# Patient Record
Sex: Male | Born: 1935 | Race: White | Hispanic: No | Marital: Married | Smoking: Former smoker
Health system: Southern US, Community
[De-identification: ages and names within clinical notes are randomized; demographics above are authoritative.]

## PROBLEM LIST (undated history)

## (undated) DIAGNOSIS — Z8679 Personal history of other diseases of the circulatory system: Secondary | ICD-10-CM

## (undated) DIAGNOSIS — I2510 Atherosclerotic heart disease of native coronary artery without angina pectoris: Secondary | ICD-10-CM

## (undated) DIAGNOSIS — R0600 Dyspnea, unspecified: Secondary | ICD-10-CM

## (undated) DIAGNOSIS — H3530 Unspecified macular degeneration: Secondary | ICD-10-CM

## (undated) DIAGNOSIS — I10 Essential (primary) hypertension: Secondary | ICD-10-CM

## (undated) DIAGNOSIS — E785 Hyperlipidemia, unspecified: Secondary | ICD-10-CM

## (undated) DIAGNOSIS — N189 Chronic kidney disease, unspecified: Secondary | ICD-10-CM

## (undated) DIAGNOSIS — G4730 Sleep apnea, unspecified: Secondary | ICD-10-CM

## (undated) DIAGNOSIS — C801 Malignant (primary) neoplasm, unspecified: Secondary | ICD-10-CM

## (undated) DIAGNOSIS — M1990 Unspecified osteoarthritis, unspecified site: Secondary | ICD-10-CM

## (undated) HISTORY — PX: HERNIA REPAIR: SHX51

## (undated) HISTORY — PX: TONSILLECTOMY: SUR1361

## (undated) HISTORY — DX: Atherosclerotic heart disease of native coronary artery without angina pectoris: I25.10

## (undated) HISTORY — PX: CHOLECYSTECTOMY: SHX55

## (undated) HISTORY — PX: CARDIAC CATHETERIZATION: SHX172

## (undated) HISTORY — PX: CARDIAC VALVE REPLACEMENT: SHX585

## (undated) HISTORY — PX: EYE SURGERY: SHX253

---
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
## 2002-04-05 ENCOUNTER — Encounter: Payer: Self-pay | Admitting: Surgery

## 2002-04-05 ENCOUNTER — Inpatient Hospital Stay (HOSPITAL_COMMUNITY): Admission: AD | Admit: 2002-04-05 | Discharge: 2002-04-10 | Payer: Self-pay | Admitting: Cardiology

## 2002-04-06 ENCOUNTER — Encounter: Payer: Self-pay | Admitting: Thoracic Surgery (Cardiothoracic Vascular Surgery)

## 2002-04-07 ENCOUNTER — Encounter: Payer: Self-pay | Admitting: Surgery

## 2002-05-09 ENCOUNTER — Encounter: Admission: RE | Admit: 2002-05-09 | Discharge: 2002-05-09 | Payer: Self-pay | Admitting: Surgery

## 2002-05-09 ENCOUNTER — Encounter: Payer: Self-pay | Admitting: Surgery

## 2011-07-16 ENCOUNTER — Other Ambulatory Visit: Payer: Self-pay | Admitting: Dermatology

## 2013-05-17 ENCOUNTER — Other Ambulatory Visit: Payer: Self-pay | Admitting: Dermatology

## 2018-01-26 DIAGNOSIS — C801 Malignant (primary) neoplasm, unspecified: Secondary | ICD-10-CM

## 2018-01-26 HISTORY — DX: Malignant (primary) neoplasm, unspecified: C80.1

## 2018-07-12 ENCOUNTER — Other Ambulatory Visit (HOSPITAL_COMMUNITY): Payer: Self-pay | Admitting: General Surgery

## 2018-07-12 ENCOUNTER — Other Ambulatory Visit: Payer: Self-pay | Admitting: General Surgery

## 2018-07-12 DIAGNOSIS — C4359 Malignant melanoma of other part of trunk: Secondary | ICD-10-CM

## 2018-07-19 ENCOUNTER — Other Ambulatory Visit: Payer: Self-pay

## 2018-07-19 ENCOUNTER — Ambulatory Visit (HOSPITAL_COMMUNITY)
Admission: RE | Admit: 2018-07-19 | Discharge: 2018-07-19 | Disposition: A | Discharge status: Home / Self Care | Payer: Medicare Other | Source: Ambulatory Visit | Attending: General Surgery | Admitting: General Surgery

## 2018-07-19 DIAGNOSIS — C4359 Malignant melanoma of other part of trunk: Secondary | ICD-10-CM | POA: Insufficient documentation

## 2018-07-19 MED ORDER — TECHNETIUM TC 99M SULFUR COLLOID FILTERED
1.0000 | Freq: Once | INTRAVENOUS | Status: AC | PRN
  Administered 2018-07-19: 1 via INTRADERMAL

## 2018-07-20 NOTE — Progress Notes (Signed)
[place], [place] [address] [place] [address] Phone: [phone] Fax: [place] [address] [place]. [place]. [place] [address] Phone: [phone] Fax: [phone]      Your procedure is scheduled on June 30  Report to [facility] Main Entrance "A" at [place].M., and check in at the Admitting office.  Call this number if you have problems the morning of surgery:  [phone]  Call [phone] if you have any questions prior to your surgery date Monday-Friday 8am-4pm    Remember:  Do not eat or drink after midnight.  You may drink clear liquids until 0530 am the morning of surgery .   Clear liquids allowed are: Water, Non-Citrus Juices (without pulp), Carbonated Beverages, Clear Tea, Black Coffee Only, and Gatorade  Please complete your PRE-SURGERY ENSURE that was provided to you by 0530 am the morning of surgery.  Please, if able, drink it in one setting. DO NOT SIP.     Take these medicines the morning of surgery with A SIP OF WATER  atorvastatin (LIPITOR) carboxymethylcellulose (REFRESH PLUS) diltiazem (DILACOR XR)  RABEprazole (ACIPHEX)  traMADol (ULTRAM)  Follow your surgeon's instructions on when to stop Aspirin.  If no instructions were given by your surgeon then you will need to call the office to get those instructions.     Starting now, days prior to surgery STOP taking any Aleve, Naproxen, Ibuprofen, Motrin, Advil, Goody's, BC's, all herbal medications, fish oil, and all vitamins.    The Morning of Surgery  Do not wear jewelry, make-up or nail polish.  Do not wear lotions, powders, or perfumes/colognes, or deodorant  Do not shave 48 hours prior to surgery.  Men may shave face and neck.  Do not bring valuables to the hospital.  [facility] is not responsible for any belongings or valuables.  If you are a smoker, DO NOT  Smoke 24 hours prior to surgery IF you wear a CPAP at night please bring your mask, tubing, and machine the morning of surgery   Remember that you must have someone to transport you home after your surgery, and remain with you for 24 hours if you are discharged the same day.   Contacts, glasses, hearing aids, dentures or bridgework may not be worn into surgery.    Leave your suitcase in the car.  After surgery it may be brought to your room.  For patients admitted to the hospital, discharge time will be determined by your treatment team.  Patients discharged the day of surgery will not be allowed to drive home.    Special instructions:   - Preparing For Surgery  Before surgery, you can play an important role. Because skin is not sterile, your skin needs to be as free of germs as possible. You can reduce the number of germs on your skin by washing with CHG (chlorahexidine gluconate) Soap before surgery.  CHG is an antiseptic cleaner which kills germs and bonds with the skin to continue killing germs even after washing.    Oral Hygiene is also important to reduce your risk of infection.  Remember - BRUSH YOUR TEETH THE MORNING OF SURGERY WITH YOUR REGULAR TOOTHPASTE  Please do not use if you have an allergy to CHG or antibacterial soaps. If your skin becomes reddened/irritated stop using the CHG.  Do not shave (including legs and underarms) for at  least 48 hours prior to first CHG shower. It is OK to shave your face.  Please follow these instructions carefully.   1. Shower the NIGHT BEFORE SURGERY and the MORNING OF SURGERY with CHG Soap.   2. If you chose to wash your hair, wash your hair first as usual with your normal shampoo.  3. After you shampoo, rinse your hair and body thoroughly to remove the shampoo.  4. Use CHG as you would any other liquid soap. You can apply CHG directly to the skin and wash gently with a scrungie or a clean washcloth.   5. Apply the CHG Soap  to your body ONLY FROM THE NECK DOWN.  Do not use on open wounds or open sores. Avoid contact with your eyes, ears, mouth and genitals (private parts). Wash Face and genitals (private parts)  with your normal soap.   6. Wash thoroughly, paying special attention to the area where your surgery will be performed.  7. Thoroughly rinse your body with warm water from the neck down.  8. DO NOT shower/wash with your normal soap after using and rinsing off the CHG Soap.  9. Pat yourself dry with a CLEAN TOWEL.  10. Wear CLEAN PAJAMAS to bed the night before surgery, wear comfortable clothes the morning of surgery  11. Place CLEAN SHEETS on your bed the night of your first shower and DO NOT SLEEP WITH PETS.    Day of Surgery:  Do not apply any deodorants/lotions.  Please wear clean clothes to the hospital/surgery center.   Remember to brush your teeth WITH YOUR REGULAR TOOTHPASTE.   Please read over the following fact sheets that you were given.

## 2018-07-21 ENCOUNTER — Other Ambulatory Visit: Payer: Self-pay

## 2018-07-21 ENCOUNTER — Encounter (HOSPITAL_COMMUNITY)
Admission: RE | Admit: 2018-07-21 | Discharge: 2018-07-21 | Disposition: A | Discharge status: Home / Self Care | Payer: Medicare Other | Source: Ambulatory Visit | Attending: General Surgery | Admitting: General Surgery

## 2018-07-21 ENCOUNTER — Encounter (HOSPITAL_COMMUNITY): Payer: Self-pay

## 2018-07-21 DIAGNOSIS — C439 Malignant melanoma of skin, unspecified: Secondary | ICD-10-CM | POA: Diagnosis not present

## 2018-07-21 DIAGNOSIS — I10 Essential (primary) hypertension: Secondary | ICD-10-CM | POA: Insufficient documentation

## 2018-07-21 DIAGNOSIS — Z1159 Encounter for screening for other viral diseases: Secondary | ICD-10-CM | POA: Diagnosis not present

## 2018-07-21 DIAGNOSIS — Z79899 Other long term (current) drug therapy: Secondary | ICD-10-CM | POA: Insufficient documentation

## 2018-07-21 DIAGNOSIS — Z01812 Encounter for preprocedural laboratory examination: Secondary | ICD-10-CM | POA: Insufficient documentation

## 2018-07-21 DIAGNOSIS — E7800 Pure hypercholesterolemia, unspecified: Secondary | ICD-10-CM | POA: Insufficient documentation

## 2018-07-21 HISTORY — DX: Unspecified osteoarthritis, unspecified site: M19.90

## 2018-07-21 HISTORY — DX: Sleep apnea, unspecified: G47.30

## 2018-07-21 HISTORY — DX: Personal history of other diseases of the circulatory system: Z86.79

## 2018-07-21 HISTORY — DX: Unspecified macular degeneration: H35.30

## 2018-07-21 HISTORY — DX: Chronic kidney disease, unspecified: N18.9

## 2018-07-21 HISTORY — DX: Hyperlipidemia, unspecified: E78.5

## 2018-07-21 HISTORY — DX: Essential (primary) hypertension: I10

## 2018-07-21 HISTORY — DX: Dyspnea, unspecified: R06.00

## 2018-07-21 LAB — CBC
HCT: 41.2 % (ref 39.0–52.0)
Hemoglobin: 13.2 g/dL (ref 13.0–17.0)
MCH: 30.1 pg (ref 26.0–34.0)
MCHC: 32 g/dL (ref 30.0–36.0)
MCV: 94.1 fL (ref 80.0–100.0)
Platelets: 241 10*3/uL (ref 150–400)
RBC: 4.38 MIL/uL (ref 4.22–5.81)
RDW: 12.6 % (ref 11.5–15.5)
WBC: 9.2 10*3/uL (ref 4.0–10.5)
nRBC: 0 % (ref 0.0–0.2)

## 2018-07-21 LAB — BASIC METABOLIC PANEL
Anion gap: 7 (ref 5–15)
BUN: 36 mg/dL — ABNORMAL HIGH (ref 8–23)
CO2: 27 mmol/L (ref 22–32)
Calcium: 9.6 mg/dL (ref 8.9–10.3)
Chloride: 106 mmol/L (ref 98–111)
Creatinine, Ser: 1.83 mg/dL — ABNORMAL HIGH (ref 0.61–1.24)
GFR calc Af Amer: 38 mL/min — ABNORMAL LOW (ref >60)
GFR calc non Af Amer: 32 mL/min — ABNORMAL LOW (ref >60)
Glucose, Bld: 93 mg/dL (ref 70–99)
Potassium: 3.9 mmol/L (ref 3.5–5.1)
Sodium: 140 mmol/L (ref 135–145)

## 2018-07-21 NOTE — Progress Notes (Signed)
PCP -  Dr. Carolin Coy  MD  Cardiologist - Dr. Otho Perl  MD last Ov May 2020  Chest x-ray -  EKG - requested Stress Test - care everywhere ECHO - care everywhere Cardiac Cath -care everyehere   Sleep Study - does not remember CPAP - yes  Fasting Blood Sugar -  Checks Blood Sugar _____ times a day  Blood Thinner Instructions:n/a Aspirin Instructions:call surgeon,s office for instructions  Anesthesia review: yes  Patient denies shortness of breath, fever, cough and chest pain at PAT appointment   Patient verbalized understanding of instructions that were given to them at the PAT appointment. Patient was also instructed that they will need to review over the PAT instructions again at home before surgery.

## 2018-07-22 ENCOUNTER — Encounter (HOSPITAL_COMMUNITY): Payer: Self-pay

## 2018-07-22 ENCOUNTER — Other Ambulatory Visit (HOSPITAL_COMMUNITY)
Admission: RE | Admit: 2018-07-22 | Discharge: 2018-07-22 | Disposition: A | Discharge status: Home / Self Care | Payer: Medicare Other | Source: Ambulatory Visit | Attending: General Surgery | Admitting: General Surgery

## 2018-07-22 ENCOUNTER — Other Ambulatory Visit: Payer: Self-pay | Admitting: General Surgery

## 2018-07-22 DIAGNOSIS — Z01812 Encounter for preprocedural laboratory examination: Secondary | ICD-10-CM | POA: Diagnosis not present

## 2018-07-22 LAB — SARS CORONAVIRUS 2 (TAT 6-24 HRS): SARS Coronavirus 2: NEGATIVE

## 2018-07-22 NOTE — Anesthesia Preprocedure Evaluation (Addendum)
Anesthesia Evaluation  Patient identified by MRN, date of birth, ID band Patient awake    Reviewed: Allergy & Precautions, NPO status , Patient's Chart, lab work & pertinent test results  Airway Mallampati: II  TM Distance: >3 FB Neck ROM: Full    Dental no notable dental hx. (+) Teeth Intact, Dental Advisory Given   Pulmonary sleep apnea and Continuous Positive Airway Pressure Ventilation , former smoker,    Pulmonary exam normal breath sounds clear to auscultation       Cardiovascular hypertension, Normal cardiovascular exam+ Valvular Problems/Murmurs (s/p AVR 2004) AS  Rhythm:Regular Rate:Normal  Stress Test 2020 Negative, EF 62%  3-14 day ZioPatch 03/18/18: Report isn't viewable in [facility], but according to Dr. Sudie Grumbling 03/22/18 office note, "His monitor demonstrated some bradycardia with heart rates in the 50s. He had one period of some significant bradycardia with heart rates in the 40s in the evening but no other significant findings. 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 After discussion, he would like to see 1 of the electrophysiologist to see if he may benefit from a pacemaker. I do not think this is unreasonable to at least get an opinion  TEE 2020 EF 60-65, normal functioning AVR  LHC 2000 unremarkable   Neuro/Psych negative neurological ROS  negative psych ROS   GI/Hepatic negative GI ROS, Neg liver ROS,   Endo/Other  negative endocrine ROS  Renal/GU Renal InsufficiencyRenal disease  negative genitourinary   Musculoskeletal  (+) Arthritis ,   Abdominal   Peds  Hematology negative hematology ROS (+)   Anesthesia Other Findings melanoma  Reproductive/Obstetrics                           Anesthesia Physical Anesthesia Plan  ASA: III  Anesthesia Plan: General and Regional   Post-op Pain Management:  Regional for Post-op pain    Induction: Intravenous  PONV Risk Score and Plan: 2 and Ondansetron and Dexamethasone  Airway Management Planned: Oral ETT  Additional Equipment:   Intra-op Plan:   Post-operative Plan: Extubation in OR  Informed Consent: I have reviewed the patients History and Physical, chart, labs and discussed the procedure including the risks, benefits and alternatives for the proposed anesthesia with the patient or authorized representative who has indicated his/her understanding and acceptance.     Dental advisory given  Plan Discussed with: CRNA  Anesthesia Plan Comments: (PAT note written 07/22/2018 by Myra Gianotti, PA-C. )      Anesthesia Quick Evaluation

## 2018-07-22 NOTE — Progress Notes (Signed)
Anesthesia Chart Review:  Case: [id number] Date/Time: 07/26/18 0815   Procedures:      BACK MELANOMA EXCISION WITH SENTINEL LYMPH NODE BIOPSY (N/A )     BILATERAL AXILLARY SENTINEL NODE BIOPSY WITH BLUE DYE (Bilateral )   Anesthesia type: General   Pre-op diagnosis: MELANOMA   Location: [place] OR ROOM 01 / [place] OR   Surgeon: Rolm Bookbinder, MD      DISCUSSION: Patient is an 85 year old male scheduled for the above procedure.  History includes former smoker, HTN, aortic stenosis (s/p AVR, pericardial tissue ~ 03/2002), exertional dyspnea, OSA (CPAP), HLD, melanoma (back), macular degeneration, CKD (stage III), edema.  Nursing staff from Dr. Cristal Generous office reported that Dr. Otho Perl had been contacted regarding surgery plans and felt to be "low risk." He had an echo, stress, and event monitor earlier this year (see below). He is scheduled to see EP cardiology next month at his request due to HR in the low 50's with one evening episode in the 40's in February.   Renal function appears stable. Presurgical COVID test on 07/22/18 is still in proceed. If negative and otherwise no acute changes then I would anticipate that he can proceed as planned. Monitor of bradycardia.    VS: BP (!) 127/54   Pulse 71   Temp 36.8 C (Temporal)   Resp 20   Ht 5' 11.5" (1.816 m)   Wt 96.2 kg   SpO2 94%   BMI 29.17 kg/m    PROVIDERS: Jefm Petty, MD is PCP [facility]) - Abran Richard, MD is cardiologist [facility]). Last visit 06/14/18. He has a July appointment with EP cardiologist Mahala Menghini, MD following monitor that showed HR in the low 50's with one episode in the 40's with underlying 1st degree AV block. Francena Hanly, MD is pulmonologist [facility]). Last visit 06/30/18.  Rema Jasmine, [place], MD is neurologist (Sleep Medicine, [facility]).    LABS: Preoperative labs noted. CBC WNL. Review of renal function trends in [facility] show a BUN 27-36 and Cr 1.53-2.04 since 05/20/17. Cr most often in the  1.6-1.70 range.  (all labs ordered are listed, but only abnormal results are displayed)  Labs Reviewed  BASIC METABOLIC PANEL - Abnormal; Notable for the following components:      Result Value   BUN 36 (*)    Creatinine, Ser 1.83 (*)    GFR calc non Af Amer 32 (*)    GFR calc Af Amer 38 (*)    All other components within normal limits  CBC    PFTs 03/31/18 [facility] CE): FVC Pre-Actual 3.47 L  FVC %Pred-Pre 91.2 %  FVC Post 3.32 L  FVC %Pred-Post 87.3 %  FVC LLN 2.69 L  FEV1 Pre-Actual 2.45 L  FEV1 %Pred-Pre 88.7 %  FEV1 Post 2.52 L  FEV1 %Pred-Post 91.1 %  FEV1 LLN 1.86 L  FEV1FVC Pre-Actual 71 %  FEV1FVC Post 76 %  FEV1FVC LLN 58 %  SVC Pre-Actual 3.45 L  SVC %Pred-Pre 78.0 %  SVC Post 3.38 L  SVC %Pred-Post 76.2 %  RVN2 %Pred-Pre 138.6 %  TLCN2 Pre-Actual 7.41 L  TLCN2 %Pred-Pre 101.8 %  TLCN2 LLN 5.70 L  DLCOUNC Pre-Actual 21.71 ml/min/mmHg  DLCOUNC %Pred-Pre 89.7 %  DLCOUNC LLN 17.32 ml/min/mmHg  DLVA %Pred-Pre 109.0 %  FEV1SVC Pre-Actual 71 %  FEV1SVC Post-Actual 75     IMAGES: CXR 06/30/18 [facility] CE): FINDINGS: Prior median sternotomy, CABG and valve replacement. Heart and mediastinal contours  are within normal limits. No focal opacities or effusions. No acute bony abnormality. Degenerative spurring throughout the thoracic spine. IMPRESSION: No active cardiopulmonary disease.   EKG: 01/15/18 (Dr. Otho Perl): SB at 55 bpm. PVCs. Right BBB.   CV: Nuclear stress test 03/22/18 [facility] CE): Summary  Uneventful Lexiscan injection  Fixed inferior defect with no associated wall motion abnormality consistent  with diaphragmatic attenuation artifact.  There is no evidence of ischemia.  Normal LV function with a calculated EF of 62 %.  3-14 day ZioPatch 03/18/18: Report isn't viewable in [facility], but according to Dr. Sudie Grumbling 03/22/18 office note, "His monitor demonstrated some bradycardia with heart rates in the 64s. He had one period of some significant bradycardia  with heart rates in the 40s in the evening but no other significant findings. 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 After discussion, he would like to see 1 of the electrophysiologist to see if he may benefit from a pacemaker. I do not think this is unreasonable to at least get an opinion."  Echo 02/15/18 [facility] CE): Summary Mild concentric left ventricular hypertrophy. Normal left ventricular size and systolic function with no appreciable segmental abnormality. Ejection fraction is visually estimated at 60-65%. Diastolic function is impaired The aortic valve replacement is functioning normally.  A mean pressure gradient of 22 mmHg was obtained. Trace aortic regurgitation. The aortic root diameter is within normal limits. The ascending aorta diameter is within normal limits. RVSP = 35 mmHg  LHC 03/05/98 ([facility]): Coronary Artery Disease Left Main: normal LAD system: insignificant LCX system: normal RCA system: normal No significant CAD indicated Left Ventriculogram Ejection Fraction: 66%   Past Medical History:  Diagnosis Date  . Arthritis   . Cancer ([place]) 2020   melonomia on back  . CKD (chronic kidney disease)   . Dyspnea    with exertion  . History of aortic stenosis    s/p AVR, pericardial tissue ~ 03/2002  . Hyperlipidemia   . Hypertension   . Macular degeneration   . Sleep apnea    CPAP    Past Surgical History:  Procedure Laterality Date  . CARDIAC CATHETERIZATION    . CARDIAC VALVE REPLACEMENT  approc. 15 years ago   [employer] Financial [employer]  . CHOLECYSTECTOMY    . EYE SURGERY Bilateral    cateract removal  . HERNIA REPAIR    . TONSILLECTOMY      MEDICATIONS: . furosemide (LASIX) 20 MG tablet  . aspirin EC 81 MG tablet  . atorvastatin (LIPITOR) 20 MG tablet  . carboxymethylcellulose (REFRESH PLUS) 0.5 % SOLN  . cholecalciferol (VITAMIN D3) 25 MCG (1000 UT) tablet  . diltiazem (DILACOR XR) 240 MG 24 hr  capsule  . fenofibrate 160 MG tablet  . Magnesium 250 MG TABS  . Multiple Vitamins-Minerals (MULTIVITAMIN WITH MINERALS) tablet  . Multiple Vitamins-Minerals (PRESERVISION AREDS 2+MULTI VIT PO)  . RABEprazole (ACIPHEX) 20 MG tablet  . traMADol (ULTRAM) 50 MG tablet  . triamterene-hydrochlorothiazide (MAXZIDE-25) 37.5-25 MG tablet   No current facility-administered medications for this encounter.   Patient advised to contact surgeon for perioperative ASA instructions.   Myra Gianotti, PA-C Surgical Short Stay/Anesthesiology [facility] Phone [phone] [facility] Phone [phone] 07/22/2018 2:13 PM

## 2018-07-26 ENCOUNTER — Ambulatory Visit (HOSPITAL_COMMUNITY)
Admission: RE | Admit: 2018-07-26 | Discharge: 2018-07-26 | Disposition: A | Discharge status: Home / Self Care | Payer: Medicare Other | Attending: General Surgery | Admitting: General Surgery

## 2018-07-26 ENCOUNTER — Ambulatory Visit (HOSPITAL_COMMUNITY): Payer: Medicare Other | Admitting: Anesthesiology

## 2018-07-26 ENCOUNTER — Encounter (HOSPITAL_COMMUNITY): Payer: Self-pay | Admitting: Certified Registered"

## 2018-07-26 ENCOUNTER — Ambulatory Visit (HOSPITAL_COMMUNITY)
Admission: RE | Admit: 2018-07-26 | Discharge: 2018-07-26 | Disposition: A | Discharge status: Home / Self Care | Payer: Medicare Other | Source: Ambulatory Visit | Attending: General Surgery | Admitting: General Surgery

## 2018-07-26 ENCOUNTER — Ambulatory Visit (HOSPITAL_COMMUNITY): Payer: Medicare Other | Admitting: Vascular Surgery

## 2018-07-26 ENCOUNTER — Encounter (HOSPITAL_COMMUNITY)
Admission: RE | Disposition: A | Discharge status: Home / Self Care | Payer: Self-pay | Source: Home / Self Care | Attending: General Surgery

## 2018-07-26 ENCOUNTER — Other Ambulatory Visit: Payer: Self-pay

## 2018-07-26 DIAGNOSIS — Z8349 Family history of other endocrine, nutritional and metabolic diseases: Secondary | ICD-10-CM | POA: Insufficient documentation

## 2018-07-26 DIAGNOSIS — L821 Other seborrheic keratosis: Secondary | ICD-10-CM | POA: Insufficient documentation

## 2018-07-26 DIAGNOSIS — Z9842 Cataract extraction status, left eye: Secondary | ICD-10-CM | POA: Insufficient documentation

## 2018-07-26 DIAGNOSIS — E7800 Pure hypercholesterolemia, unspecified: Secondary | ICD-10-CM | POA: Diagnosis not present

## 2018-07-26 DIAGNOSIS — Z825 Family history of asthma and other chronic lower respiratory diseases: Secondary | ICD-10-CM | POA: Insufficient documentation

## 2018-07-26 DIAGNOSIS — C4359 Malignant melanoma of other part of trunk: Secondary | ICD-10-CM

## 2018-07-26 DIAGNOSIS — I10 Essential (primary) hypertension: Secondary | ICD-10-CM | POA: Insufficient documentation

## 2018-07-26 DIAGNOSIS — Z79899 Other long term (current) drug therapy: Secondary | ICD-10-CM | POA: Diagnosis not present

## 2018-07-26 DIAGNOSIS — Z886 Allergy status to analgesic agent status: Secondary | ICD-10-CM | POA: Insufficient documentation

## 2018-07-26 DIAGNOSIS — R001 Bradycardia, unspecified: Secondary | ICD-10-CM | POA: Diagnosis not present

## 2018-07-26 DIAGNOSIS — K219 Gastro-esophageal reflux disease without esophagitis: Secondary | ICD-10-CM | POA: Diagnosis not present

## 2018-07-26 DIAGNOSIS — M1990 Unspecified osteoarthritis, unspecified site: Secondary | ICD-10-CM | POA: Insufficient documentation

## 2018-07-26 DIAGNOSIS — Z9049 Acquired absence of other specified parts of digestive tract: Secondary | ICD-10-CM | POA: Diagnosis not present

## 2018-07-26 DIAGNOSIS — Z809 Family history of malignant neoplasm, unspecified: Secondary | ICD-10-CM | POA: Insufficient documentation

## 2018-07-26 DIAGNOSIS — Z9841 Cataract extraction status, right eye: Secondary | ICD-10-CM | POA: Diagnosis not present

## 2018-07-26 DIAGNOSIS — G4730 Sleep apnea, unspecified: Secondary | ICD-10-CM | POA: Diagnosis not present

## 2018-07-26 DIAGNOSIS — Z8249 Family history of ischemic heart disease and other diseases of the circulatory system: Secondary | ICD-10-CM | POA: Insufficient documentation

## 2018-07-26 DIAGNOSIS — Z952 Presence of prosthetic heart valve: Secondary | ICD-10-CM | POA: Insufficient documentation

## 2018-07-26 DIAGNOSIS — I440 Atrioventricular block, first degree: Secondary | ICD-10-CM | POA: Insufficient documentation

## 2018-07-26 DIAGNOSIS — Z87891 Personal history of nicotine dependence: Secondary | ICD-10-CM | POA: Diagnosis not present

## 2018-07-26 DIAGNOSIS — Z803 Family history of malignant neoplasm of breast: Secondary | ICD-10-CM | POA: Insufficient documentation

## 2018-07-26 HISTORY — PX: SENTINEL NODE BIOPSY: SHX6608

## 2018-07-26 HISTORY — PX: MELANOMA EXCISION WITH SENTINEL LYMPH NODE BIOPSY: SHX5267

## 2018-07-26 SURGERY — MELANOMA EXCISION WITH SENTINEL LYMPH NODE BIOPSY
Anesthesia: Regional | Site: Back

## 2018-07-26 MED ORDER — ACETAMINOPHEN 500 MG PO TABS
ORAL_TABLET | ORAL | Status: AC
  Administered 2018-07-26: 07:00:00 1000 mg via ORAL
  Filled 2018-07-26: qty 2

## 2018-07-26 MED ORDER — TECHNETIUM TC 99M SULFUR COLLOID FILTERED
0.5000 | Freq: Once | INTRAVENOUS | Status: AC | PRN
  Administered 2018-07-26: 0.5 via INTRADERMAL

## 2018-07-26 MED ORDER — FENTANYL CITRATE (PF) 250 MCG/5ML IJ SOLN
INTRAMUSCULAR | Status: AC
  Filled 2018-07-26: qty 5

## 2018-07-26 MED ORDER — SODIUM CHLORIDE 0.9% FLUSH: 3.0000 mL | Freq: Two times a day (BID) | INTRAVENOUS | Status: DC

## 2018-07-26 MED ORDER — SODIUM CHLORIDE 0.9 % IV SOLN
INTRAVENOUS | Status: DC | PRN
  Administered 2018-07-26: 09:00:00 25 ug/min via INTRAVENOUS

## 2018-07-26 MED ORDER — SODIUM CHLORIDE 0.9 % IV SOLN: INTRAVENOUS | Status: DC

## 2018-07-26 MED ORDER — LACTATED RINGERS IV SOLN
INTRAVENOUS | Status: DC
  Administered 2018-07-26: 07:00:00 via INTRAVENOUS

## 2018-07-26 MED ORDER — OXYCODONE HCL 5 MG PO TABS: 5.0000 mg | ORAL_TABLET | ORAL | Status: DC | PRN

## 2018-07-26 MED ORDER — FENTANYL CITRATE (PF) 100 MCG/2ML IJ SOLN
INTRAMUSCULAR | Status: AC
  Administered 2018-07-26: 08:00:00 100 ug via INTRAVENOUS
  Filled 2018-07-26: qty 2

## 2018-07-26 MED ORDER — CEFAZOLIN SODIUM-DEXTROSE 2-4 GM/100ML-% IV SOLN
INTRAVENOUS | Status: AC
  Filled 2018-07-26: qty 100

## 2018-07-26 MED ORDER — BUPIVACAINE-EPINEPHRINE 0.25% -1:200000 IJ SOLN
INTRAMUSCULAR | Status: DC | PRN
  Administered 2018-07-26: 10 mL

## 2018-07-26 MED ORDER — ENSURE PRE-SURGERY PO LIQD
296.0000 mL | Freq: Once | ORAL | Status: DC
  Filled 2018-07-26: qty 296

## 2018-07-26 MED ORDER — BACITRACIN ZINC 500 UNIT/GM EX OINT
TOPICAL_OINTMENT | CUTANEOUS | Status: AC
  Filled 2018-07-26: qty 28.35

## 2018-07-26 MED ORDER — SUGAMMADEX SODIUM 200 MG/2ML IV SOLN
INTRAVENOUS | Status: DC | PRN
  Administered 2018-07-26: 200 mg via INTRAVENOUS

## 2018-07-26 MED ORDER — ACETAMINOPHEN 650 MG RE SUPP: 650.0000 mg | RECTAL | Status: DC | PRN

## 2018-07-26 MED ORDER — FENTANYL CITRATE (PF) 100 MCG/2ML IJ SOLN
100.0000 ug | Freq: Once | INTRAMUSCULAR | Status: AC
  Administered 2018-07-26: 08:00:00 100 ug via INTRAVENOUS

## 2018-07-26 MED ORDER — CLONIDINE HCL (ANALGESIA) 100 MCG/ML EP SOLN
EPIDURAL | Status: DC | PRN
  Administered 2018-07-26 (×2): 50 ug

## 2018-07-26 MED ORDER — PROPOFOL 10 MG/ML IV BOLUS
INTRAVENOUS | Status: AC
  Filled 2018-07-26: qty 20

## 2018-07-26 MED ORDER — ONDANSETRON HCL 4 MG/2ML IJ SOLN
INTRAMUSCULAR | Status: DC | PRN
  Administered 2018-07-26: 4 mg via INTRAVENOUS

## 2018-07-26 MED ORDER — DEXAMETHASONE SODIUM PHOSPHATE 10 MG/ML IJ SOLN
INTRAMUSCULAR | Status: DC | PRN
  Administered 2018-07-26: 10 mg via INTRAVENOUS

## 2018-07-26 MED ORDER — LIDOCAINE 2% (20 MG/ML) 5 ML SYRINGE
INTRAMUSCULAR | Status: DC | PRN
  Administered 2018-07-26: 60 mg via INTRAVENOUS

## 2018-07-26 MED ORDER — ACETAMINOPHEN 500 MG PO TABS: 1000.0000 mg | ORAL_TABLET | ORAL | Status: DC

## 2018-07-26 MED ORDER — SODIUM CHLORIDE 0.9 % IV SOLN: 250.0000 mL | INTRAVENOUS | Status: DC | PRN

## 2018-07-26 MED ORDER — BUPIVACAINE-EPINEPHRINE 0.25% -1:200000 IJ SOLN
INTRAMUSCULAR | Status: AC
  Filled 2018-07-26: qty 1

## 2018-07-26 MED ORDER — TRAMADOL HCL 50 MG PO TABS: 50.0000 mg | ORAL_TABLET | Freq: Four times a day (QID) | ORAL | 0 refills | Status: DC | PRN

## 2018-07-26 MED ORDER — BUPIVACAINE-EPINEPHRINE (PF) 0.5% -1:200000 IJ SOLN
INTRAMUSCULAR | Status: DC | PRN
  Administered 2018-07-26 (×2): 25 mL via PERINEURAL

## 2018-07-26 MED ORDER — CHLORHEXIDINE GLUCONATE CLOTH 2 % EX PADS: 6.0000 | MEDICATED_PAD | Freq: Once | CUTANEOUS | Status: DC

## 2018-07-26 MED ORDER — ACETAMINOPHEN 325 MG PO TABS: 650.0000 mg | ORAL_TABLET | ORAL | Status: DC | PRN

## 2018-07-26 MED ORDER — LACTATED RINGERS IV SOLN
INTRAVENOUS | Status: DC | PRN
  Administered 2018-07-26 (×2): via INTRAVENOUS

## 2018-07-26 MED ORDER — METHYLENE BLUE 0.5 % INJ SOLN
INTRAVENOUS | Status: AC
  Filled 2018-07-26: qty 10

## 2018-07-26 MED ORDER — SODIUM CHLORIDE 0.9% FLUSH: 3.0000 mL | INTRAVENOUS | Status: DC | PRN

## 2018-07-26 MED ORDER — BACITRACIN ZINC 500 UNIT/GM EX OINT
TOPICAL_OINTMENT | CUTANEOUS | Status: DC | PRN
  Administered 2018-07-26: 1 via TOPICAL

## 2018-07-26 MED ORDER — ROCURONIUM BROMIDE 10 MG/ML (PF) SYRINGE
PREFILLED_SYRINGE | INTRAVENOUS | Status: DC | PRN
  Administered 2018-07-26: 40 mg via INTRAVENOUS

## 2018-07-26 MED ORDER — MIDAZOLAM HCL 2 MG/2ML IJ SOLN
INTRAMUSCULAR | Status: AC
  Filled 2018-07-26: qty 2

## 2018-07-26 MED ORDER — GABAPENTIN 300 MG PO CAPS
ORAL_CAPSULE | ORAL | Status: AC
  Administered 2018-07-26: 07:00:00 100 mg via ORAL
  Filled 2018-07-26: qty 1

## 2018-07-26 MED ORDER — ACETAMINOPHEN 500 MG PO TABS
1000.0000 mg | ORAL_TABLET | Freq: Once | ORAL | Status: AC
  Administered 2018-07-26: 07:00:00 1000 mg via ORAL

## 2018-07-26 MED ORDER — HEMOSTATIC AGENTS (NO CHARGE) OPTIME
TOPICAL | Status: DC | PRN
  Administered 2018-07-26: 1 via TOPICAL

## 2018-07-26 MED ORDER — PROPOFOL 10 MG/ML IV BOLUS
INTRAVENOUS | Status: DC | PRN
  Administered 2018-07-26: 100 mg via INTRAVENOUS

## 2018-07-26 MED ORDER — 0.9 % SODIUM CHLORIDE (POUR BTL) OPTIME
TOPICAL | Status: DC | PRN
  Administered 2018-07-26: 1000 mL

## 2018-07-26 MED ORDER — CEFAZOLIN SODIUM-DEXTROSE 2-4 GM/100ML-% IV SOLN
2.0000 g | INTRAVENOUS | Status: AC
  Administered 2018-07-26: 09:00:00 2 g via INTRAVENOUS

## 2018-07-26 MED ORDER — GABAPENTIN 100 MG PO CAPS
100.0000 mg | ORAL_CAPSULE | ORAL | Status: AC
  Administered 2018-07-26: 07:00:00 100 mg via ORAL
  Filled 2018-07-26: qty 1

## 2018-07-26 MED ORDER — SUCCINYLCHOLINE CHLORIDE 20 MG/ML IJ SOLN
INTRAMUSCULAR | Status: DC | PRN
  Administered 2018-07-26: 160 mg via INTRAVENOUS

## 2018-07-26 SURGICAL SUPPLY — 58 items
APPLIER CLIP 9.375 MED OPEN (MISCELLANEOUS) ×4
BLADE CLIPPER SURG (BLADE) IMPLANT
CHLORAPREP W/TINT 26 (MISCELLANEOUS) ×8 IMPLANT
CLIP APPLIE 9.375 MED OPEN (MISCELLANEOUS) ×2 IMPLANT
CLOSURE STERI-STRIP 1/2X4 (GAUZE/BANDAGES/DRESSINGS) ×1
CLSR STERI-STRIP ANTIMIC 1/2X4 (GAUZE/BANDAGES/DRESSINGS) ×1 IMPLANT
CONT SPEC 4OZ CLIKSEAL STRL BL (MISCELLANEOUS) ×10 IMPLANT
COVER SURGICAL LIGHT HANDLE (MISCELLANEOUS) ×6 IMPLANT
COVER WAND RF STERILE (DRAPES) ×4 IMPLANT
DECANTER SPIKE VIAL GLASS SM (MISCELLANEOUS) IMPLANT
DERMABOND ADVANCED (GAUZE/BANDAGES/DRESSINGS) ×2
DERMABOND ADVANCED .7 DNX12 (GAUZE/BANDAGES/DRESSINGS) ×2 IMPLANT
DRAPE LAPAROTOMY 100X72 PEDS (DRAPES) ×4 IMPLANT
DRAPE ORTHO SPLIT 77X108 STRL (DRAPES) ×4
DRAPE SURG ORHT 6 SPLT 77X108 (DRAPES) IMPLANT
DRSG TEGADERM 4X4.75 (GAUZE/BANDAGES/DRESSINGS) ×2 IMPLANT
ELECT CAUTERY BLADE 6.4 (BLADE) ×6 IMPLANT
ELECT REM PT RETURN 9FT ADLT (ELECTROSURGICAL) ×4
ELECTRODE REM PT RTRN 9FT ADLT (ELECTROSURGICAL) ×2 IMPLANT
GAUZE 4X4 16PLY RFD (DISPOSABLE) ×4 IMPLANT
GAUZE SPONGE 4X4 12PLY STRL (GAUZE/BANDAGES/DRESSINGS) ×4 IMPLANT
GLOVE BIO SURGEON STRL SZ7 (GLOVE) ×6 IMPLANT
GLOVE BIOGEL PI IND STRL 6.5 (GLOVE) IMPLANT
GLOVE BIOGEL PI IND STRL 7.5 (GLOVE) ×2 IMPLANT
GLOVE BIOGEL PI INDICATOR 6.5 (GLOVE) ×2
GLOVE BIOGEL PI INDICATOR 7.5 (GLOVE) ×4
GLOVE SURG SS PI 6.5 STRL IVOR (GLOVE) ×2 IMPLANT
GOWN STRL REUS W/ TWL LRG LVL3 (GOWN DISPOSABLE) ×4 IMPLANT
GOWN STRL REUS W/TWL LRG LVL3 (GOWN DISPOSABLE) ×8
HEMOSTAT ARISTA ABSORB 3G PWDR (HEMOSTASIS) ×2 IMPLANT
KIT BASIN OR (CUSTOM PROCEDURE TRAY) ×4 IMPLANT
KIT TURNOVER KIT B (KITS) ×4 IMPLANT
NDL 18GX1X1/2 (RX/OR ONLY) (NEEDLE) IMPLANT
NDL FILTER BLUNT 18X1 1/2 (NEEDLE) IMPLANT
NDL HYPO 25GX1X1/2 BEV (NEEDLE) IMPLANT
NEEDLE 18GX1X1/2 (RX/OR ONLY) (NEEDLE) IMPLANT
NEEDLE FILTER BLUNT 18X 1/2SAF (NEEDLE)
NEEDLE FILTER BLUNT 18X1 1/2 (NEEDLE) IMPLANT
NEEDLE HYPO 25GX1X1/2 BEV (NEEDLE) IMPLANT
NS IRRIG 1000ML POUR BTL (IV SOLUTION) ×4 IMPLANT
PACK SURGICAL SETUP 50X90 (CUSTOM PROCEDURE TRAY) ×4 IMPLANT
PAD ARMBOARD 7.5X6 YLW CONV (MISCELLANEOUS) ×8 IMPLANT
PENCIL BUTTON HOLSTER BLD 10FT (ELECTRODE) ×4 IMPLANT
PENCIL SMOKE EVACUATOR (MISCELLANEOUS) ×2 IMPLANT
SPONGE LAP 4X18 RFD (DISPOSABLE) ×4 IMPLANT
SUT ETHILON 2 0 FS 18 (SUTURE) ×6 IMPLANT
SUT MNCRL AB 4-0 PS2 18 (SUTURE) ×4 IMPLANT
SUT SILK 2 0 SH (SUTURE) ×4 IMPLANT
SUT VIC AB 2-0 SH 18 (SUTURE) ×4 IMPLANT
SUT VIC AB 2-0 SH 27 (SUTURE) ×2
SUT VIC AB 2-0 SH 27X BRD (SUTURE) IMPLANT
SUT VIC AB 3-0 SH 18 (SUTURE) ×2 IMPLANT
SUT VIC AB 3-0 SH 27 (SUTURE) ×4
SUT VIC AB 3-0 SH 27X BRD (SUTURE) ×2 IMPLANT
SUT VIC AB 3-0 SH 8-18 (SUTURE) ×2 IMPLANT
SYR CONTROL 10ML LL (SYRINGE) ×4 IMPLANT
TOWEL GREEN STERILE FF (TOWEL DISPOSABLE) ×4 IMPLANT
TOWEL OR 17X26 10 PK STRL BLUE (TOWEL DISPOSABLE) ×4 IMPLANT

## 2018-07-26 NOTE — Anesthesia Procedure Notes (Signed)
Procedure Name: Intubation Date/Time: 07/26/2018 8:38 AM Performed by: Gaylene Brooks, CRNA Pre-anesthesia Checklist: Patient identified, Emergency Drugs available, Suction available and Patient being monitored Patient Re-evaluated:Patient Re-evaluated prior to induction Oxygen Delivery Method: Circle System Utilized Preoxygenation: Pre-oxygenation with 100% oxygen Induction Type: IV induction Laryngoscope Size: Miller and 2 Grade View: Grade II Tube type: Oral Tube size: 7.5 mm Number of attempts: 1 Airway Equipment and Method: Stylet and Oral airway Placement Confirmation: ETT inserted through vocal cords under direct vision,  positive ETCO2 and breath sounds checked- equal and bilateral Secured at: 23 cm Tube secured with: Tape Dental Injury: Teeth and Oropharynx as per pre-operative assessment

## 2018-07-26 NOTE — Anesthesia Postprocedure Evaluation (Signed)
Anesthesia Post Note  Patient: SHRIHAAN PORZIO  Procedure(s) Performed: BACK MELANOMA EXCISION (N/A Back) LEFT SENTINEL LYMPH NODE BIOPSY (Left Arm Upper)     Patient location during evaluation: PACU Anesthesia Type: Regional and General Level of consciousness: awake and alert Pain management: pain level controlled Vital Signs Assessment: post-procedure vital signs reviewed and stable Respiratory status: spontaneous breathing, nonlabored ventilation, respiratory function stable and patient connected to nasal cannula oxygen Cardiovascular status: blood pressure returned to baseline and stable Postop Assessment: no apparent nausea or vomiting Anesthetic complications: no    Last Vitals:  Vitals:   07/26/18 1030 07/26/18 1040  BP: (!) 112/51 (!) 112/51  Pulse: (!) 57 (!) 55  Resp: 17 10  Temp:  36.6 C  SpO2: 94% 94%    Last Pain:  Vitals:   07/26/18 1040  TempSrc:   PainSc: 0-No pain                 Bishop Vanderwerf L Quantisha Marsicano

## 2018-07-26 NOTE — Transfer of Care (Signed)
Immediate Anesthesia Transfer of Care Note  Patient: Casey Rios  Procedure(s) Performed: BACK MELANOMA EXCISION (N/A Back) LEFT SENTINEL LYMPH NODE BIOPSY (Left Arm Upper)  Patient Location: PACU  Anesthesia Type:GA combined with regional for post-op pain  Level of Consciousness: awake, alert  and oriented  Airway & Oxygen Therapy: Patient Spontanous Breathing and Patient connected to nasal cannula oxygen  Post-op Assessment: Report given to RN, Post -op Vital signs reviewed and stable and Patient moving all extremities X 4  Post vital signs: Reviewed and stable  Last Vitals:  Vitals Value Taken Time  BP 116/59 07/26/18 1021  Temp 36.4 C 07/26/18 1020  Pulse 56 07/26/18 1022  Resp 13 07/26/18 1022  SpO2 98 % 07/26/18 1022  Vitals shown include unvalidated device data.  Last Pain:  Vitals:   07/26/18 0820  TempSrc:   PainSc: 0-No pain         Complications: No apparent anesthesia complications

## 2018-07-26 NOTE — Op Note (Addendum)
Preoperative diagnosis: T3 back melanoma Postoperative diagnosis: Same as above Procedure: 1.  Wide local excision of back melanoma 2.  Left axillary sentinel lymph node biopsy Surgeon: Dr. Serita Grammes Anesthesia: General with pectoral blocks Estimated blood loss: Minimal Complications: None Drains: None Specimens: 1.  Wide local excision of back melanoma marked short superior, long left side, double deep 2.  Left axillary sentinel lymph nodes Sponge needle count was correct at completion Decision to disposition to recovery in stable condition  Indications: This is an 85 year old male who had a changing back nevus.  This was shaved biopsied by Dr. Ubaldo Glassing and was found to be a T3 melanoma.  I saw him and evaluated him.  We discussed wide local excision.  He had a preoperative lymphoscintigram that showed drainage to both axillae.  We discussed wide local excision as well as likely bilateral axillary sentinel lymph node biopsy.  Procedure: After informed consent was obtained the patient first underwent technetium injection in the standard fashion around his melanoma.  He was given antibiotics.  SCDs were in place.  He was placed under general anesthesia without complication.  He was then rolled into the prone position and appropriately padded.  He was prepped and draped in the standard sterile surgical fashion.  Surgical timeout was then performed.  I marked up to centimeter margins around the shave biopsy site.  I then excised an area that was 6 x 13 cm of skin and subcutaneous tissue down to his fascia.  This was then marked as above.  Hemostasis was obtained.  I mobilized some of the tissue above the back fascia to bring the skin together.  I then closed the skin with multiple 3-0 interrupted Vicryl's.  I closed the dermis with a 4-0 Monocryl in a subcuticular fashion.  I placed five 2-0 nylon horizontal mattress sutures to hold this together as well.  Sterile dressings were placed.  He was  then rolled supine and brought back over to the operating room table to do the sentinel lymph node biopsy.  He was then prepped and draped again in the standard sterile surgical fashion.  A timeout was then repeated.  I listened to both sides he clearly had activity on the left side but there was no activity on the right side at all.  I made the decision just to do the left side.  I then infiltrated Marcaine and made an incision below the axillary hairline.  I used the neoprobe to guide me to excise what appeared to be 2 or 3 lymph nodes.  I then passed these off the table.  There was no background radioactivity.  Hemostasis was obtained.  I then closed this with 2-0 Vicryl, 3-0 Vicryl, 4-0 Monocryl.  Glue and Steri-Strips were applied.  He tolerated this well was extubated and transferred to recovery stable.  I attempted to call his wife but I got no answer.

## 2018-07-26 NOTE — Anesthesia Procedure Notes (Signed)
Anesthesia Regional Block: Pectoralis block   Pre-Anesthetic Checklist: ,, timeout performed, Correct Patient, Correct Site, Correct Laterality, Correct Procedure, Correct Position, site marked, Risks and benefits discussed,  Surgical consent,  Pre-op evaluation,  At surgeon's request and post-op pain management  Laterality: Left  Prep: Maximum Sterile Barrier Precautions used, chloraprep       Needles:  Injection technique: Single-shot  Needle Type: Echogenic Stimulator Needle     Needle Length: 9cm  Needle Gauge: 22     Additional Needles:   Procedures:,,,, ultrasound used (permanent image in chart),,,,  Narrative:  Start time: 07/26/2018 8:06 AM End time: 07/26/2018 8:16 AM Injection made incrementally with aspirations every 5 mL.  Performed by: Personally  Anesthesiologist: Freddrick March, MD  Additional Notes: Monitors applied. No increased pain on injection. No increased resistance to injection. Injection made in 5cc increments. Good needle visualization. Patient tolerated procedure well.

## 2018-07-26 NOTE — H&P (Signed)
85 yof referred by Dr Ubaldo Glassing. he is pretty healthy overall. has porcine aortic valve, osa on cpap and htn. followed by cardiology good activity level. no prior skin cancer. noted a back lesion he could feel and that was bleeding. underwent shave biopsy by Dr Ubaldo Glassing that shows superficial spreading melanoma with at least 2.5 mm depth, has pos peripheral and deep margins. has ulceration present. he is here to discuss options today   Past Surgical History Sabino Gasser, CMA; 07/12/2018 10:55 AM) Cataract Surgery  Bilateral. Gallbladder Surgery - Laparoscopic  Laparoscopic Inguinal Hernia Surgery  Left. Prostate Surgery - Removal  Tonsillectomy  TURP  Valve Replacement   Diagnostic Studies History Sabino Gasser, CMA; 07/12/2018 10:55 AM) Colonoscopy  5-10 years ago  Allergies Sabino Gasser, CMA; 07/12/2018 11:00 AM) CeleBREX *ANALGESICS - ANTI-INFLAMMATORY*  Allergies Reconciled   Medication History Sabino Gasser, CMA; 07/12/2018 10:59 AM) traMADol HCl (50MG  Tablet, Oral) Active. Atorvastatin Calcium (20MG  Tablet, Oral) Active. dilTIAZem HCl ER Coated Beads (240MG  Capsule ER 24HR, Oral) Active. Fenofibrate (160MG  Tablet, Oral) Active. RABEprazole Sodium (20MG  Tablet DR, Oral) Active. Triamterene-HCTZ (37.5-25MG  Tablet, Oral) Active. Lisinopril-hydroCHLOROthiazide (20-25MG  Tablet, Oral) Active. Furosemide (20MG  Tablet, Oral) Active. Medications Reconciled Trelegy Ellipta (100-62.5-25MCG/INH Aero Pow Br Act, Inhalation) Active.  Social History Sabino Gasser, [place]; 07/12/2018 10:55 AM) Alcohol use  Moderate alcohol use. Caffeine use  Coffee. No drug use  Tobacco use  Former smoker.  Family History Sabino Gasser, [place]; 07/12/2018 10:55 AM) Breast Cancer  Daughter. Cancer  Father. Heart Disease  Brother. Heart disease in male family member before age 26  Respiratory Condition  Mother. Thyroid problems  Father.  Other Problems Sabino Gasser, CMA;  07/12/2018 10:55 AM) Back Pain  Cholelithiasis  Enlarged Prostate  Gastroesophageal Reflux Disease  High blood pressure  Hypercholesterolemia  Pancreatitis   Review of Systems Sabino Gasser CMA; 07/12/2018 10:55 AM) General Present- Fatigue. Not Present- Appetite Loss, Chills, Fever, Night Sweats, Weight Gain and Weight Loss. Skin Present- New Lesions and Non-Healing Wounds. Not Present- Change in Wart/Mole, Dryness, Hives, Jaundice, Rash and Ulcer. HEENT Present- Hearing Loss and Wears glasses/contact lenses. Not Present- Earache, Hoarseness, Nose Bleed, Oral Ulcers, Ringing in the Ears, Seasonal Allergies, Sinus Pain, Sore Throat, Visual Disturbances and Yellow Eyes. Respiratory Not Present- Bloody sputum, Chronic Cough, Difficulty Breathing, Snoring and Wheezing. Gastrointestinal Not Present- Abdominal Pain, Bloating, Bloody Stool, Change in Bowel Habits, Chronic diarrhea, Constipation, Difficulty Swallowing, Excessive gas, Gets full quickly at meals, Hemorrhoids, Indigestion, Nausea, Rectal Pain and Vomiting. Male Genitourinary Present- Frequency. Not Present- Blood in Urine, Change in Urinary Stream, Impotence, Nocturia, Painful Urination, Urgency and Urine Leakage. Musculoskeletal Present- Back Pain and Joint Stiffness. Not Present- Joint Pain, Muscle Pain, Muscle Weakness and Swelling of Extremities. Neurological Present- Weakness. Not Present- Decreased Memory, Fainting, Headaches, Numbness, Seizures, Tingling, Tremor and Trouble walking. Psychiatric Not Present- Anxiety, Bipolar, Change in Sleep Pattern, Depression, Fearful and Frequent crying. Endocrine Not Present- Cold Intolerance, Excessive Hunger, Hair Changes, Heat Intolerance, Hot flashes and New Diabetes. Hematology Present- Blood Thinners and Easy Bruising. Not Present- Excessive bleeding, Gland problems, HIV and Persistent Infections.  Vitals Sabino Gasser CMA; 07/12/2018 10:58 AM) 07/12/2018 10:57 AM Weight: 212.25  lb Height: 71in Body Surface Area: 2.16 m Body Mass Index: 29.6 kg/m  Temp.: 98.57F (Oral)  Pulse: 78 (Regular)  BP: 130/72(Sitting, Left Arm, Standard) Physical Exam Rolm Bookbinder MD; 07/12/2018 11:34 AM) General Mental Status-Alert. Orientation-Oriented X3. Head and Neck Trachea-midline. Thyroid Gland Characteristics - normal size and consistency. Eye Sclera/Conjunctiva -  Bilateral-No scleral icterus. Chest and Lung Exam Chest and lung exam reveals -quiet, even and easy respiratory effort with no use of accessory muscles. Cardiovascular Cardiovascular examination reveals -normal heart sounds, regular rate and rhythm with no murmurs. Neurologic Neurologic evaluation reveals -alert and oriented x 3 with no impairment of recent or remote memory. Lymphatic General Lymphatics -Note: no [place] adenopathy. Head & Neck General Head & Neck Lymphatics: Bilateral - Description - Normal. Axillary General Axillary Region: Bilateral - Description - Normal.  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 I do think merits a sn biopsy and will do lymphoscintigram first to see where nodal basin is. we discussed sn biopsy and risks/benefits/indications today I also discussed wle with 2 cm margins. we discussed length of incision, external sutures, healing issues, wound issues possible. likely can go home same day as long as he uses cpap. He has bilateral ax nodes on lymphoscinitigram so will plan bilateral ax node biopsies if can identify today

## 2018-07-26 NOTE — Anesthesia Procedure Notes (Signed)
Anesthesia Regional Block: Pectoralis block   Pre-Anesthetic Checklist: ,, timeout performed, Correct Patient, Correct Site, Correct Laterality, Correct Procedure, Correct Position, site marked, Risks and benefits discussed,  Surgical consent,  Pre-op evaluation,  At surgeon's request and post-op pain management  Laterality: Right  Prep: Maximum Sterile Barrier Precautions used, chloraprep       Needles:  Injection technique: Single-shot  Needle Type: Echogenic Stimulator Needle     Needle Length: 9cm  Needle Gauge: 22     Additional Needles:   Procedures:,,,, ultrasound used (permanent image in chart),,,,  Narrative:  Start time: 07/26/2018 7:50 AM End time: 07/26/2018 8:00 AM Injection made incrementally with aspirations every 5 mL.  Performed by: Personally  Anesthesiologist: Freddrick March, MD  Additional Notes: Monitors applied. No increased pain on injection. No increased resistance to injection. Injection made in 5cc increments. Good needle visualization. Patient tolerated procedure well.

## 2018-07-26 NOTE — Interval H&P Note (Signed)
History and Physical Interval Note:  07/26/2018 7:21 AM  Casey Rios  has presented today for surgery, with the diagnosis of MELANOMA.  The various methods of treatment have been discussed with the patient and family. After consideration of risks, benefits and other options for treatment, the patient has consented to  Procedure(s): BACK MELANOMA EXCISION WITH BILATERAL SENTINEL LYMPH NODE BIOPSY AND BLUE DYE INJECTION (N/A) as a surgical intervention.  The patient's history has been reviewed, patient examined, no change in status, stable for surgery.  I have reviewed the patient's chart and labs.  Questions were answered to the patient's satisfaction.     Rolm Bookbinder

## 2018-07-26 NOTE — Discharge Instructions (Signed)
[facility] Office Phone Number [phone]  POST OP INSTRUCTIONS Take 400 mg of ibuprofen every 8 hours or 650 mg tylenol every 6 hours for next 72 hours then as needed. Use ice several times daily also. Always review your discharge instruction sheet given to you by the facility where your surgery was performed.  IF YOU HAVE DISABILITY OR FAMILY LEAVE FORMS, YOU MUST BRING THEM TO THE OFFICE FOR PROCESSING.  DO NOT GIVE THEM TO YOUR DOCTOR.  1. A prescription for pain medication may be given to you upon discharge.  Take your pain medication as prescribed, if needed.  If narcotic pain medicine is not needed, then you may take acetaminophen (Tylenol), naprosyn (Alleve) or ibuprofen (Advil) as needed. 2. Take your usually prescribed medications unless otherwise directed 3. If you need a refill on your pain medication, please contact your pharmacy.  They will contact our office to request authorization.  Prescriptions will not be filled after 5pm or on week-ends. 4. You should eat very light the first 24 hours after surgery, such as soup, crackers, pudding, etc.  Resume your normal diet the day after surgery. 5. Most patients will experience some swelling and bruising.  Ice packs will help.   6. It is common to experience some constipation if taking pain medication after surgery.  Increasing fluid intake and taking a stool softener will usually help or prevent this problem from occurring.  A mild laxative (Milk of Magnesia or Miralax) should be taken according to package directions if there are no bowel movements after 48 hours. 7. You may remove your bandages 48 hours after surgery and you may shower at that time.  You may have steri-strips (small skin tapes) in place directly over the incision.  These strips should be left on the skin and will come off on their own.  The glue will flake off over the next 2-3 weeks.  Any sutures or staples will be removed at the office during your  follow-up visit. 8. ACTIVITIES:  You may resume regular daily activities (gradually increasing) beginning the next day.  Wearing a good support bra or sports bra minimizes pain and swelling.  You may have sexual intercourse when it is comfortable. a. You may drive when you no longer are taking prescription pain medication, you can comfortably wear a seatbelt, and you can safely maneuver your car and apply brakes. b. RETURN TO WORK:  ______________________________________________________________________________________ 9. You should see your doctor in the office for a follow-up appointment approximately two weeks after your surgery.  Your doctors nurse will typically make your follow-up appointment when she calls you with your pathology report.  Expect your pathology report 3-4 business days after your surgery.  You may call to check if you do not hear from [place] after three days. 10. OTHER INSTRUCTIONS: _______________________________________________________________________________________________ _____________________________________________________________________________________________________________________________________ _____________________________________________________________________________________________________________________________________ _____________________________________________________________________________________________________________________________________  WHEN TO CALL DR Cheyna Retana: 1. Fever over 101.0 2. Nausea and/or vomiting. 3. Extreme swelling or bruising. 4. Continued bleeding from incision. 5. Increased pain, redness, or drainage from the incision.  The clinic staff is available to answer your questions during regular business hours.  Please dont hesitate to call and ask to speak to one of the nurses for clinical concerns.  If you have a medical emergency, go to the nearest emergency room or call 911.  A surgeon from [facility] is always on  call at the hospital.  For further questions, please visit [email or web address]

## 2018-07-27 ENCOUNTER — Encounter (HOSPITAL_COMMUNITY): Payer: Self-pay | Admitting: General Surgery

## 2018-08-10 ENCOUNTER — Emergency Department (HOSPITAL_BASED_OUTPATIENT_CLINIC_OR_DEPARTMENT_OTHER): Payer: Medicare Other

## 2018-08-10 ENCOUNTER — Other Ambulatory Visit: Payer: Self-pay

## 2018-08-10 ENCOUNTER — Emergency Department (HOSPITAL_BASED_OUTPATIENT_CLINIC_OR_DEPARTMENT_OTHER)
Admission: EM | Admit: 2018-08-10 | Discharge: 2018-08-11 | Disposition: A | Discharge status: Home / Self Care | Payer: Medicare Other | Attending: Emergency Medicine | Admitting: Emergency Medicine

## 2018-08-10 ENCOUNTER — Encounter (HOSPITAL_BASED_OUTPATIENT_CLINIC_OR_DEPARTMENT_OTHER): Payer: Self-pay | Admitting: Emergency Medicine

## 2018-08-10 DIAGNOSIS — Z79899 Other long term (current) drug therapy: Secondary | ICD-10-CM | POA: Diagnosis not present

## 2018-08-10 DIAGNOSIS — N189 Chronic kidney disease, unspecified: Secondary | ICD-10-CM | POA: Insufficient documentation

## 2018-08-10 DIAGNOSIS — Z87891 Personal history of nicotine dependence: Secondary | ICD-10-CM | POA: Diagnosis not present

## 2018-08-10 DIAGNOSIS — Z952 Presence of prosthetic heart valve: Secondary | ICD-10-CM | POA: Diagnosis not present

## 2018-08-10 DIAGNOSIS — I129 Hypertensive chronic kidney disease with stage 1 through stage 4 chronic kidney disease, or unspecified chronic kidney disease: Secondary | ICD-10-CM | POA: Insufficient documentation

## 2018-08-10 DIAGNOSIS — Z85820 Personal history of malignant melanoma of skin: Secondary | ICD-10-CM | POA: Insufficient documentation

## 2018-08-10 DIAGNOSIS — R1031 Right lower quadrant pain: Secondary | ICD-10-CM | POA: Diagnosis not present

## 2018-08-10 DIAGNOSIS — Z7982 Long term (current) use of aspirin: Secondary | ICD-10-CM | POA: Insufficient documentation

## 2018-08-10 DIAGNOSIS — Z87448 Personal history of other diseases of urinary system: Secondary | ICD-10-CM

## 2018-08-10 LAB — BASIC METABOLIC PANEL
Anion gap: 12 (ref 5–15)
BUN: 34 mg/dL — ABNORMAL HIGH (ref 8–23)
CO2: 26 mmol/L (ref 22–32)
Calcium: 9.4 mg/dL (ref 8.9–10.3)
Chloride: 98 mmol/L (ref 98–111)
Creatinine, Ser: 2.17 mg/dL — ABNORMAL HIGH (ref 0.61–1.24)
GFR calc Af Amer: 31 mL/min — ABNORMAL LOW (ref >60)
GFR calc non Af Amer: 26 mL/min — ABNORMAL LOW (ref >60)
Glucose, Bld: 121 mg/dL — ABNORMAL HIGH (ref 70–99)
Potassium: 4 mmol/L (ref 3.5–5.1)
Sodium: 136 mmol/L (ref 135–145)

## 2018-08-10 LAB — CBC WITH DIFFERENTIAL/PLATELET
Abs Immature Granulocytes: 0.03 10*3/uL (ref 0.00–0.07)
Basophils Absolute: 0.1 10*3/uL (ref 0.0–0.1)
Basophils Relative: 1 %
Eosinophils Absolute: 0.4 10*3/uL (ref 0.0–0.5)
Eosinophils Relative: 5 %
HCT: 37.8 % — ABNORMAL LOW (ref 39.0–52.0)
Hemoglobin: 12.3 g/dL — ABNORMAL LOW (ref 13.0–17.0)
Immature Granulocytes: 0 %
Lymphocytes Relative: 20 %
Lymphs Abs: 1.8 10*3/uL (ref 0.7–4.0)
MCH: 30.3 pg (ref 26.0–34.0)
MCHC: 32.5 g/dL (ref 30.0–36.0)
MCV: 93.1 fL (ref 80.0–100.0)
Monocytes Absolute: 0.8 10*3/uL (ref 0.1–1.0)
Monocytes Relative: 8 %
Neutro Abs: 6.1 10*3/uL (ref 1.7–7.7)
Neutrophils Relative %: 66 %
Platelets: 253 10*3/uL (ref 150–400)
RBC: 4.06 MIL/uL — ABNORMAL LOW (ref 4.22–5.81)
RDW: 12.8 % (ref 11.5–15.5)
WBC: 9.2 10*3/uL (ref 4.0–10.5)
nRBC: 0 % (ref 0.0–0.2)

## 2018-08-10 NOTE — ED Triage Notes (Signed)
Pt c/o RLQ pain since this afternoon. Pt states he has swelling and tenderness. Pt reports he has been having constipation since 6/30 post op.

## 2018-08-10 NOTE — ED Provider Notes (Signed)
[facility] Provider Note: Georgena Spurling, MD, FACEP  CSN: [id number] MRN: [id number] ARRIVAL: 08/10/18 at 2240 ROOM: [place]  Abdominal Pain   HISTORY OF PRESENT ILLNESS  08/10/18 10:49 PM Casey Rios is a 85 y.o. male who had a wide excision of a melanoma on his back on June 30.  Margins were clear.  He states that since the surgery his bowels have not been moving as well as usual.  He has been taking a stool softener and recently started taking MiraLAX at the advice of his surgeon, Dr. Donne Hazel.  This afternoon he developed pain in his right lower quadrant with a sensation of a mass or swelling.  The pain is been intermittent.  It is worse when he stands or moves.  He denies pain at the present time but on arrival he rated his pain is a 3 out of 10.  He has not had vomiting or diarrhea with it.  He has had some difficulty swallowing since his surgery which he likens to GERD but worse.  He has had bowel movements recently which were looser than usual but he attributes this to the MiraLAX.   Past Medical History:  Diagnosis Date  . Arthritis   . Cancer ([place]) 2020   melonomia on back  . CKD (chronic kidney disease)   . Dyspnea    with exertion  . History of aortic stenosis    s/p AVR, pericardial tissue ~ 03/2002  . Hyperlipidemia   . Hypertension   . Macular degeneration   . Sleep apnea    CPAP    Past Surgical History:  Procedure Laterality Date  . CARDIAC CATHETERIZATION    . CARDIAC VALVE REPLACEMENT     aortic valve replacement, pericardial tissue ~ 03/2002  . CHOLECYSTECTOMY    . EYE SURGERY Bilateral    cateract removal  . HERNIA REPAIR    . MELANOMA EXCISION WITH SENTINEL LYMPH NODE BIOPSY N/A 07/26/2018   Procedure: BACK MELANOMA EXCISION;  Surgeon: Rolm Bookbinder, MD;  Location: [facility];  Service: General;  Laterality: N/A;  . SENTINEL NODE BIOPSY Left 07/26/2018   Procedure: LEFT SENTINEL LYMPH NODE BIOPSY;  Surgeon:  Rolm Bookbinder, MD;  Location: [facility];  Service: General;  Laterality: Left;  . TONSILLECTOMY      No family history on file.  Social History   Tobacco Use  . Smoking status: Former Smoker    Packs/day: 2.00    Years: 20.00    Pack years: 40.00    Types: Cigarettes  . Smokeless tobacco: Never Used  Substance Use Topics  . Alcohol use: Not on file  . Drug use: Not on file    Prior to Admission medications   Medication Sig Start Date End Date Taking? Authorizing Provider  docusate sodium (COLACE) 100 MG capsule Take 100 mg by mouth 2 (two) times daily.   Yes [provider]  polyethylene glycol (MIRALAX / GLYCOLAX) 17 g packet Take 17 g by mouth daily.   Yes [provider]  aspirin EC 81 MG tablet Take 81 mg by mouth daily.    [provider]  atorvastatin (LIPITOR) 20 MG tablet Take 20 mg by mouth daily.    [provider]  carboxymethylcellulose (REFRESH PLUS) 0.5 % SOLN Place 1 drop into both eyes 3 (three) times daily as needed (dry eyes).    [provider]  cholecalciferol (VITAMIN D3) 25 MCG (1000 UT) tablet Take 1,000 Units  by mouth daily.    [provider]  diltiazem (DILACOR XR) 240 MG 24 hr capsule Take 240 mg by mouth daily.    [provider]  fenofibrate 160 MG tablet Take 80 mg by mouth daily.    [provider]  furosemide (LASIX) 20 MG tablet Take 20 mg by mouth daily. Take 1/2 pill once a day    [provider]  Magnesium 250 MG TABS Take 250 mg by mouth daily.    [provider]  Multiple Vitamins-Minerals (MULTIVITAMIN WITH MINERALS) tablet Take 1 tablet by mouth daily.    [provider]  Multiple Vitamins-Minerals (PRESERVISION AREDS 2+MULTI VIT PO) Take 1 capsule by mouth 2 (two) times a day.    [provider]  RABEprazole (ACIPHEX) 20 MG tablet Take 20 mg by mouth daily.    [provider]  traMADol (ULTRAM) 50 MG tablet Take 50 mg by  mouth 2 (two) times daily as needed (pain).    [provider]  traMADol (ULTRAM) 50 MG tablet Take 1 tablet (50 mg total) by mouth every 6 (six) hours as needed. 07/26/18   Rolm Bookbinder, MD  triamterene-hydrochlorothiazide (MAXZIDE-25) 37.5-25 MG tablet Take 1 tablet by mouth daily.    [provider]    Allergies Celebrex [celecoxib]   REVIEW OF SYSTEMS  Negative except as noted here or in the History of Present Illness.   PHYSICAL EXAMINATION  Initial Vital Signs Blood pressure (!) 142/67, pulse 76, temperature 98.4 F (36.9 C), resp. rate 18, height 5\' 11"  (1.803 m), weight 93.4 kg, SpO2 96 %.  Examination General: Well-developed, well-nourished male in no acute distress; appearance consistent with age of record HENT: normocephalic; atraumatic Eyes: pupils equal, round and reactive to light; extraocular muscles intact; bilateral pseudophakia Neck: supple Heart: regular rate and rhythm Lungs: clear to auscultation bilaterally Abdomen: soft; nondistended; nontender; no masses or hepatosplenomegaly; bowel sounds present Extremities: No deformity; full range of motion; pulses normal Neurologic: Awake, alert and oriented; motor function intact in all extremities and symmetric; no facial droop Skin: Warm and dry Psychiatric: Normal mood and affect   RESULTS  Summary of this visit's results, reviewed by myself:   EKG Interpretation  Date/Time:    Ventricular Rate:    PR Interval:    QRS Duration:   QT Interval:    QTC Calculation:   R Axis:     Text Interpretation:        Laboratory Studies: Results for orders placed or performed during the hospital encounter of 08/10/18 (from the past 24 hour(s))  CBC with Differential/Platelet     Status: Abnormal   Collection Time: 08/10/18 11:28 PM  Result Value Ref Range   WBC 9.2 4.0 - 10.5 K/uL   RBC 4.06 (L) 4.22 - 5.81 MIL/uL   Hemoglobin 12.3 (L) 13.0 - 17.0 g/dL   HCT 37.8 (L) 39.0 - 52.0 %   MCV  93.1 80.0 - 100.0 fL   MCH 30.3 26.0 - 34.0 pg   MCHC 32.5 30.0 - 36.0 g/dL   RDW 12.8 11.5 - 15.5 %   Platelets 253 150 - 400 K/uL   nRBC 0.0 0.0 - 0.2 %   Neutrophils Relative % 66 %   Neutro Abs 6.1 1.7 - 7.7 K/uL   Lymphocytes Relative 20 %   Lymphs Abs 1.8 0.7 - 4.0 K/uL   Monocytes Relative 8 %   Monocytes Absolute 0.8 0.1 - 1.0 K/uL   Eosinophils Relative 5 %  Eosinophils Absolute 0.4 0.0 - 0.5 K/uL   Basophils Relative 1 %   Basophils Absolute 0.1 0.0 - 0.1 K/uL   Immature Granulocytes 0 %   Abs Immature Granulocytes 0.03 0.00 - 0.07 K/uL  Basic metabolic panel     Status: Abnormal   Collection Time: 08/10/18 11:28 PM  Result Value Ref Range   Sodium 136 135 - 145 mmol/L   Potassium 4.0 3.5 - 5.1 mmol/L   Chloride 98 98 - 111 mmol/L   CO2 26 22 - 32 mmol/L   Glucose, Bld 121 (H) 70 - 99 mg/dL   BUN 34 (H) 8 - 23 mg/dL   Creatinine, Ser 2.17 (H) 0.61 - 1.24 mg/dL   Calcium 9.4 8.9 - 10.3 mg/dL   GFR calc non Af Amer 26 (L) >60 mL/min   GFR calc Af Amer 31 (L) >60 mL/min   Anion gap 12 5 - 15  Urinalysis, Routine w reflex microscopic     Status: Abnormal   Collection Time: 08/11/18 12:50 AM  Result Value Ref Range   Color, Urine STRAW (A) YELLOW   APPearance CLEAR CLEAR   Specific Gravity, Urine 1.010 1.005 - 1.030   pH 5.0 5.0 - 8.0   Glucose, UA NEGATIVE NEGATIVE mg/dL   Hgb urine dipstick NEGATIVE NEGATIVE   Bilirubin Urine NEGATIVE NEGATIVE   Ketones, ur NEGATIVE NEGATIVE mg/dL   Protein, ur NEGATIVE NEGATIVE mg/dL   Nitrite NEGATIVE NEGATIVE   Leukocytes,Ua NEGATIVE NEGATIVE   Imaging Studies: Ct Abdomen Pelvis Wo Contrast  Result Date: 08/11/2018 CLINICAL DATA:  Right lower quadrant pain EXAM: CT ABDOMEN AND PELVIS WITHOUT CONTRAST TECHNIQUE: Multidetector CT imaging of the abdomen and pelvis was performed following the standard protocol without IV contrast. COMPARISON:  CT chest 08/04/2018 FINDINGS: Lower chest: Lung bases demonstrate no acute  consolidation or effusion. Mild subpleural fibrosis in the right lower lobe. Normal heart size. Hepatobiliary: 13 mm hypodensity within the left hepatic lobe, likely a cyst. Status post cholecystectomy. No biliary dilatation. Pancreas: Unremarkable. No pancreatic ductal dilatation or surrounding inflammatory changes. Spleen: Normal in size without focal abnormality. Adrenals/Urinary Tract: Adrenal glands are normal. No hydronephrosis. Multiple low-attenuation foci within both kidneys, likely cysts but further evaluation limited without contrast. Slightly indistinct appearing bladder wall. Stomach/Bowel: Stomach is nonenlarged. No dilated small bowel. Negative appendix. Mild stool in the colon. No colon wall thickening. Vascular/Lymphatic: Moderate to marked aortic atherosclerosis without aneurysm. No significant adenopathy. Reproductive: Status post hysterectomy. No adnexal masses. Other: Negative for free air or free fluid. Musculoskeletal: Mild scoliosis of the spine. Diffuse degenerative changes throughout the lumbar spine. Scattered low-density foci within the lumbar vertebra, appear contiguous with the endplates and are probably Schmorl's nodes. IMPRESSION: 1. No CT evidence for acute intra-abdominal or pelvic abnormality. 2. Slightly indistinct appearance of the urinary bladder with minimal wall thickening, questionable for cystitis. Electronically Signed   By: Donavan Foil M.D.   On: 08/11/2018 01:26    ED COURSE and MDM  Nursing notes and initial vitals signs, including pulse oximetry, reviewed.  Vitals:   08/10/18 2245 08/10/18 2246 08/11/18 0103  BP: (!) 142/67  126/67  Pulse: 76  68  Resp: 18  18  Temp: 98.4 F (36.9 C)    SpO2: 96%  95%  Weight:  93.4 kg   Height:  5\' 11"  (1.803 m)    1:48 AM Abdomen soft, nontender.  Patient's CT scan reassuring.  He was advised of his elevated creatinine which is higher than it  was 3 weeks ago.  He does have known chronic kidney disease.  He was  advised to contact his primary care physician, Dr. Jeralene Huff.   PROCEDURES    ED DIAGNOSES     ICD-10-CM   1. Right lower quadrant abdominal pain  R10.31   2. History of chronic kidney disease  Z87.448        Belinda [place], MD 08/11/18 [phone]

## 2018-08-11 DIAGNOSIS — R1031 Right lower quadrant pain: Secondary | ICD-10-CM | POA: Diagnosis not present

## 2018-08-11 LAB — URINALYSIS, ROUTINE W REFLEX MICROSCOPIC
Bilirubin Urine: NEGATIVE
Glucose, UA: NEGATIVE mg/dL
Hgb urine dipstick: NEGATIVE
Ketones, ur: NEGATIVE mg/dL
Leukocytes,Ua: NEGATIVE
Nitrite: NEGATIVE
Protein, ur: NEGATIVE mg/dL
Specific Gravity, Urine: 1.01 (ref 1.005–1.030)
pH: 5 (ref 5.0–8.0)

## 2018-08-11 NOTE — ED Notes (Signed)
Returned from CT.

## 2018-08-11 NOTE — ED Notes (Signed)
Patient transported to CT 

## 2018-08-14 ENCOUNTER — Emergency Department (HOSPITAL_BASED_OUTPATIENT_CLINIC_OR_DEPARTMENT_OTHER)
Admission: EM | Admit: 2018-08-14 | Discharge: 2018-08-14 | Disposition: A | Discharge status: Home / Self Care | Payer: Medicare Other | Attending: Emergency Medicine | Admitting: Emergency Medicine

## 2018-08-14 ENCOUNTER — Other Ambulatory Visit: Payer: Self-pay

## 2018-08-14 ENCOUNTER — Encounter (HOSPITAL_BASED_OUTPATIENT_CLINIC_OR_DEPARTMENT_OTHER): Payer: Self-pay | Admitting: *Deleted

## 2018-08-14 DIAGNOSIS — I129 Hypertensive chronic kidney disease with stage 1 through stage 4 chronic kidney disease, or unspecified chronic kidney disease: Secondary | ICD-10-CM | POA: Diagnosis not present

## 2018-08-14 DIAGNOSIS — Z85828 Personal history of other malignant neoplasm of skin: Secondary | ICD-10-CM | POA: Insufficient documentation

## 2018-08-14 DIAGNOSIS — Z79899 Other long term (current) drug therapy: Secondary | ICD-10-CM | POA: Insufficient documentation

## 2018-08-14 DIAGNOSIS — R2232 Localized swelling, mass and lump, left upper limb: Secondary | ICD-10-CM | POA: Insufficient documentation

## 2018-08-14 DIAGNOSIS — Z952 Presence of prosthetic heart valve: Secondary | ICD-10-CM | POA: Insufficient documentation

## 2018-08-14 DIAGNOSIS — Z87891 Personal history of nicotine dependence: Secondary | ICD-10-CM | POA: Insufficient documentation

## 2018-08-14 DIAGNOSIS — M545 Low back pain, unspecified: Secondary | ICD-10-CM

## 2018-08-14 DIAGNOSIS — N189 Chronic kidney disease, unspecified: Secondary | ICD-10-CM | POA: Diagnosis not present

## 2018-08-14 DIAGNOSIS — R109 Unspecified abdominal pain: Secondary | ICD-10-CM | POA: Diagnosis not present

## 2018-08-14 MED ORDER — PREDNISONE 20 MG PO TABS: 40.0000 mg | ORAL_TABLET | Freq: Every day | ORAL | 0 refills | Status: DC

## 2018-08-14 MED ORDER — METHOCARBAMOL 500 MG PO TABS: 500.0000 mg | ORAL_TABLET | Freq: Three times a day (TID) | ORAL | 0 refills | Status: DC | PRN

## 2018-08-14 NOTE — ED Notes (Signed)
ED Provider at bedside. 

## 2018-08-14 NOTE — ED Provider Notes (Signed)
[facility]ERG[facility] DEPARTMENT Provider Note   CSN: [id number] Arrival date & time: 08/14/18  0950    History   Chief Complaint Chief Complaint  Patient presents with  . Abdominal Pain    HPI Casey Rios is a 85 y.o. male.     HPI Patient presents with right back pain that radiates to his abdomen.  Right lower back.  Is had over the last 2 to 3 weeks.  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  Has been seen in the ER few days ago and had CT scan that was reassuring at that time.  Thought could have been due to constipation.  No fevers or chills.  No pain when he has a bowel movement but states when he turns to wipe he has pain in his back.  Certain positions seem to make the pain worse.  No numbness or weakness.  No dysuria. States he does have some swelling of his left axilla.  Had lymph node biopsy there.  States it does not seem more swollen than it did before. Past Medical History:  Diagnosis Date  . Arthritis   . 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 CKD (chronic kidney disease)   . Dyspnea    with exertion  . History of aortic stenosis    s/p AVR, pericardial tissue ~ 03/2002  . Hyperlipidemia   . Hypertension   . Macular degeneration   . Sleep apnea    CPAP    There are no active problems to display for this patient.   Past Surgical History:  Procedure Laterality Date  . CARDIAC CATHETERIZATION    . CARDIAC VALVE REPLACEMENT     aortic valve replacement, pericardial tissue ~ 03/2002  . CHOLECYSTECTOMY    . EYE SURGERY Bilateral    cateract removal  . HERNIA REPAIR    . MELANOMA EXCISION WITH SENTINEL LYMPH NODE BIOPSY N/A 07/26/2018   Procedure: BACK MELANOMA EXCISION;  Surgeon: Rolm Bookbinder, MD;  Location: [facility];  Service: General;  Laterality: N/A;  . SENTINEL NODE BIOPSY Left 07/26/2018   Procedure: LEFT SENTINEL LYMPH NODE BIOPSY;  Surgeon: Rolm Bookbinder, MD;  Location: [facility];  Service: General;  Laterality: Left;  .  TONSILLECTOMY          Home Medications    Prior to Admission medications   Medication Sig Start Date End Date Taking? Authorizing Provider  aspirin EC 81 MG tablet Take 81 mg by mouth daily.   Yes [provider]  atorvastatin (LIPITOR) 20 MG tablet Take 20 mg by mouth daily.   Yes [provider]  carboxymethylcellulose (REFRESH PLUS) 0.5 % SOLN Place 1 drop into both eyes 3 (three) times daily as needed (dry eyes).   Yes [provider]  cholecalciferol (VITAMIN D3) 25 MCG (1000 UT) tablet Take 1,000 Units by mouth daily.   Yes [provider]  diltiazem (DILACOR XR) 240 MG 24 hr capsule Take 240 mg by mouth daily.   Yes [provider]  docusate sodium (COLACE) 100 MG capsule Take 100 mg by mouth 2 (two) times daily.   Yes [provider]  fenofibrate 160 MG tablet Take 80 mg by mouth daily.   Yes [provider]  furosemide (LASIX) 20 MG tablet Take 20 mg by mouth daily. Take 1/2 pill once a day   Yes [provider]  Magnesium 250 MG TABS Take 250 mg by mouth daily.   Yes [provider]  Multiple Vitamins-Minerals (MULTIVITAMIN WITH MINERALS) tablet Take 1 tablet by mouth daily.   Yes [provider]  Multiple Vitamins-Minerals (PRESERVISION AREDS 2+MULTI VIT PO) Take 1 capsule by mouth 2 (two) times a day.   Yes [provider]  polyethylene glycol (MIRALAX / GLYCOLAX) 17 g packet Take 17 g by mouth daily.   Yes [provider]  RABEprazole (ACIPHEX) 20 MG tablet Take 20 mg by mouth daily.   Yes [provider]  traMADol (ULTRAM) 50 MG tablet Take 1 tablet (50 mg total) by mouth every 6 (six) hours as needed. 07/26/18  Yes Rolm Bookbinder, MD  triamterene-hydrochlorothiazide (MAXZIDE-25) 37.5-25 MG tablet Take 1 tablet by mouth daily.   Yes [provider]  methocarbamol (ROBAXIN) 500 MG tablet Take 1 tablet (500 mg total) by mouth every 8 (eight) hours as  needed for muscle spasms. 08/14/18   Davonna Belling, MD  predniSONE (DELTASONE) 20 MG tablet Take 2 tablets (40 mg total) by mouth daily. 08/14/18   Davonna Belling, MD    Family History Family History  Problem Relation Age of Onset  . Diabetes Mother   . Emphysema Mother   . Cancer Father   . Heart attack Brother     Social History Social History   Tobacco Use  . Smoking status: Former Smoker    Packs/day: 2.00    Years: 20.00    Pack years: 40.00    Types: Cigarettes  . Smokeless tobacco: Never Used  Substance Use Topics  . Alcohol use: Yes    Alcohol/week: 1.0 standard drinks    Types: 1 Glasses of wine per week    Comment: daily  . Drug use: Never     Allergies   Celebrex [celecoxib]   Review of Systems Review of Systems  Constitutional: Negative for appetite change and fever.  Cardiovascular: Negative for chest pain.  Gastrointestinal: Positive for abdominal pain and diarrhea.  Genitourinary: Negative for flank pain.  Musculoskeletal: Positive for back pain.  Skin: Negative for rash.  Neurological: Negative for weakness.  Psychiatric/Behavioral: Negative for confusion.     Physical Exam Updated Vital Signs BP (!) 148/74 (BP Location: Right Arm)   Pulse 78   Temp 98.2 F (36.8 C) (Oral)   Resp 18   Ht 5\' 11"  (1.803 m)   Wt 94.4 kg   SpO2 99%   BMI 29.02 kg/m   Physical Exam Vitals signs and nursing note reviewed.  Cardiovascular:     Rate and Rhythm: Normal rate.  Pulmonary:     Breath sounds: No rhonchi.  Abdominal:     General: There is no abdominal bruit.     Tenderness: There is no abdominal tenderness.     Hernia: No hernia is present.  Genitourinary:    Comments: No right CVA tenderness. Skin:    Comments: No rash on right flank.  Neurological:     General: No focal deficit present.     Mental Status: He is alert.   Musculoskeletal: Patient has pain with active straight leg raise on left side.  No pain with passive straight  leg raise.  Sensation intact over bilateral lower extremities.  Tenderness over right lower back musculature.  No swelling or deformity. Left axilla has swelling from lymph node surgery.  The upper mid back is a mildly erythematous swollen area from melanoma surgery.   ED Treatments / Results  Labs (all labs ordered are listed, but only abnormal results are displayed) Labs Reviewed - No  data to display  EKG None  Radiology No results found.  Procedures Procedures (including critical care time)  Medications Ordered in ED Medications - No data to display   Initial Impression / Assessment and Plan / ED Course  I have reviewed the triage vital signs and the nursing notes.  Pertinent labs & imaging results that were available during my care of the patient were reviewed by me and considered in my medical decision making (see chart for details).        Patient with back pain that radiates to front.  There is no tenderness over her abdomen.  Recent CT scan reassuring.  Reviewed labs.  Had urine without infection.  Had a mild worsening renal function however.  Appendix had been reviewed and was normal.  With the tenderness over the lumbar spine I think this is more likely musculoskeletal pain.  Metastatic disease considered but felt less likely and could be followed as an outpatient.  Will treat with some steroids and muscle relaxer.  Has chronic pain medicine through his pain clinic.  Follow-up with pain clinic and PCP as needed.  Final Clinical Impressions(s) / ED Diagnoses   Final diagnoses:  Acute right-sided low back pain without sciatica    ED Discharge Orders         Ordered    predniSONE (DELTASONE) 20 MG tablet  Daily     08/14/18 1050    methocarbamol (ROBAXIN) 500 MG tablet  Every 8 hours PRN     08/14/18 1050           Davonna Belling, MD 08/14/18 1059

## 2018-08-14 NOTE — ED Triage Notes (Signed)
RLQ pain radiating to his back.  He was here this past Thursday of the same complaint.

## 2018-08-15 ENCOUNTER — Other Ambulatory Visit: Payer: Self-pay

## 2018-08-15 ENCOUNTER — Encounter (HOSPITAL_COMMUNITY): Payer: Self-pay | Admitting: Emergency Medicine

## 2018-08-15 ENCOUNTER — Observation Stay (HOSPITAL_COMMUNITY)
Admission: EM | Admit: 2018-08-15 | Discharge: 2018-08-16 | Disposition: A | Discharge status: Home / Self Care | Payer: Medicare Other | Attending: Emergency Medicine | Admitting: Emergency Medicine

## 2018-08-15 ENCOUNTER — Emergency Department (HOSPITAL_COMMUNITY): Payer: Medicare Other

## 2018-08-15 DIAGNOSIS — M549 Dorsalgia, unspecified: Secondary | ICD-10-CM | POA: Insufficient documentation

## 2018-08-15 DIAGNOSIS — N179 Acute kidney failure, unspecified: Secondary | ICD-10-CM | POA: Diagnosis not present

## 2018-08-15 DIAGNOSIS — Y838 Other surgical procedures as the cause of abnormal reaction of the patient, or of later complication, without mention of misadventure at the time of the procedure: Secondary | ICD-10-CM | POA: Diagnosis not present

## 2018-08-15 DIAGNOSIS — K5900 Constipation, unspecified: Secondary | ICD-10-CM | POA: Insufficient documentation

## 2018-08-15 DIAGNOSIS — M25559 Pain in unspecified hip: Secondary | ICD-10-CM | POA: Insufficient documentation

## 2018-08-15 DIAGNOSIS — T148XXA Other injury of unspecified body region, initial encounter: Secondary | ICD-10-CM | POA: Diagnosis present

## 2018-08-15 DIAGNOSIS — E785 Hyperlipidemia, unspecified: Secondary | ICD-10-CM | POA: Diagnosis not present

## 2018-08-15 DIAGNOSIS — T7840XA Allergy, unspecified, initial encounter: Secondary | ICD-10-CM | POA: Insufficient documentation

## 2018-08-15 DIAGNOSIS — Z87891 Personal history of nicotine dependence: Secondary | ICD-10-CM | POA: Diagnosis not present

## 2018-08-15 DIAGNOSIS — Z1159 Encounter for screening for other viral diseases: Secondary | ICD-10-CM | POA: Diagnosis not present

## 2018-08-15 DIAGNOSIS — Z79899 Other long term (current) drug therapy: Secondary | ICD-10-CM | POA: Insufficient documentation

## 2018-08-15 DIAGNOSIS — M1990 Unspecified osteoarthritis, unspecified site: Secondary | ICD-10-CM | POA: Insufficient documentation

## 2018-08-15 DIAGNOSIS — I700 Atherosclerosis of aorta: Secondary | ICD-10-CM | POA: Diagnosis not present

## 2018-08-15 DIAGNOSIS — D72829 Elevated white blood cell count, unspecified: Secondary | ICD-10-CM | POA: Diagnosis not present

## 2018-08-15 DIAGNOSIS — L7633 Postprocedural seroma of skin and subcutaneous tissue following a dermatologic procedure: Principal | ICD-10-CM | POA: Diagnosis present

## 2018-08-15 DIAGNOSIS — Z952 Presence of prosthetic heart valve: Secondary | ICD-10-CM | POA: Diagnosis not present

## 2018-08-15 DIAGNOSIS — G4733 Obstructive sleep apnea (adult) (pediatric): Secondary | ICD-10-CM | POA: Insufficient documentation

## 2018-08-15 DIAGNOSIS — Z85820 Personal history of malignant melanoma of skin: Secondary | ICD-10-CM | POA: Insufficient documentation

## 2018-08-15 DIAGNOSIS — I129 Hypertensive chronic kidney disease with stage 1 through stage 4 chronic kidney disease, or unspecified chronic kidney disease: Secondary | ICD-10-CM | POA: Insufficient documentation

## 2018-08-15 DIAGNOSIS — I2510 Atherosclerotic heart disease of native coronary artery without angina pectoris: Secondary | ICD-10-CM | POA: Diagnosis not present

## 2018-08-15 DIAGNOSIS — N189 Chronic kidney disease, unspecified: Secondary | ICD-10-CM | POA: Diagnosis not present

## 2018-08-15 DIAGNOSIS — L24A9 Irritant contact dermatitis due friction or contact with other specified body fluids: Secondary | ICD-10-CM

## 2018-08-15 DIAGNOSIS — G8918 Other acute postprocedural pain: Secondary | ICD-10-CM

## 2018-08-15 DIAGNOSIS — I350 Nonrheumatic aortic (valve) stenosis: Secondary | ICD-10-CM | POA: Insufficient documentation

## 2018-08-15 LAB — CBC WITH DIFFERENTIAL/PLATELET
Abs Immature Granulocytes: 0.06 10*3/uL (ref 0.00–0.07)
Basophils Absolute: 0 10*3/uL (ref 0.0–0.1)
Basophils Relative: 0 %
Eosinophils Absolute: 0 10*3/uL (ref 0.0–0.5)
Eosinophils Relative: 0 %
HCT: 41.5 % (ref 39.0–52.0)
Hemoglobin: 13.7 g/dL (ref 13.0–17.0)
Immature Granulocytes: 0 %
Lymphocytes Relative: 5 %
Lymphs Abs: 0.8 10*3/uL (ref 0.7–4.0)
MCH: 30.2 pg (ref 26.0–34.0)
MCHC: 33 g/dL (ref 30.0–36.0)
MCV: 91.6 fL (ref 80.0–100.0)
Monocytes Absolute: 1.4 10*3/uL — ABNORMAL HIGH (ref 0.1–1.0)
Monocytes Relative: 9 %
Neutro Abs: 13.4 10*3/uL — ABNORMAL HIGH (ref 1.7–7.7)
Neutrophils Relative %: 86 %
Platelets: 253 10*3/uL (ref 150–400)
RBC: 4.53 MIL/uL (ref 4.22–5.81)
RDW: 12.9 % (ref 11.5–15.5)
WBC: 15.6 10*3/uL — ABNORMAL HIGH (ref 4.0–10.5)
nRBC: 0 % (ref 0.0–0.2)

## 2018-08-15 LAB — URINALYSIS, ROUTINE W REFLEX MICROSCOPIC
Bacteria, UA: NONE SEEN
Bilirubin Urine: NEGATIVE
Glucose, UA: NEGATIVE mg/dL
Hgb urine dipstick: NEGATIVE
Ketones, ur: NEGATIVE mg/dL
Nitrite: NEGATIVE
Protein, ur: NEGATIVE mg/dL
Specific Gravity, Urine: 1.024 (ref 1.005–1.030)
pH: 5 (ref 5.0–8.0)

## 2018-08-15 LAB — COMPREHENSIVE METABOLIC PANEL
ALT: 14 U/L (ref 0–44)
AST: 19 U/L (ref 15–41)
Albumin: 3.7 g/dL (ref 3.5–5.0)
Alkaline Phosphatase: 45 U/L (ref 38–126)
Anion gap: 11 (ref 5–15)
BUN: 35 mg/dL — ABNORMAL HIGH (ref 8–23)
CO2: 24 mmol/L (ref 22–32)
Calcium: 9.5 mg/dL (ref 8.9–10.3)
Chloride: 103 mmol/L (ref 98–111)
Creatinine, Ser: 2.32 mg/dL — ABNORMAL HIGH (ref 0.61–1.24)
GFR calc Af Amer: 28 mL/min — ABNORMAL LOW (ref >60)
GFR calc non Af Amer: 24 mL/min — ABNORMAL LOW (ref >60)
Glucose, Bld: 157 mg/dL — ABNORMAL HIGH (ref 70–99)
Potassium: 4.2 mmol/L (ref 3.5–5.1)
Sodium: 138 mmol/L (ref 135–145)
Total Bilirubin: 0.9 mg/dL (ref 0.3–1.2)
Total Protein: 6.3 g/dL — ABNORMAL LOW (ref 6.5–8.1)

## 2018-08-15 LAB — SARS CORONAVIRUS 2 BY RT PCR (HOSPITAL ORDER, PERFORMED IN ~~LOC~~ HOSPITAL LAB): SARS Coronavirus 2: NEGATIVE

## 2018-08-15 MED ORDER — PANTOPRAZOLE SODIUM 40 MG PO TBEC
40.0000 mg | DELAYED_RELEASE_TABLET | Freq: Every day | ORAL | Status: DC
  Administered 2018-08-15 – 2018-08-16 (×2): 40 mg via ORAL
  Filled 2018-08-15 (×2): qty 1

## 2018-08-15 MED ORDER — DIPHENHYDRAMINE HCL 12.5 MG/5ML PO ELIX
12.5000 mg | ORAL_SOLUTION | Freq: Four times a day (QID) | ORAL | Status: DC | PRN
  Administered 2018-08-15 – 2018-08-16 (×2): 12.5 mg via ORAL
  Filled 2018-08-15 (×2): qty 10

## 2018-08-15 MED ORDER — CARBOXYMETHYLCELLULOSE SODIUM 0.5 % OP SOLN: 1.0000 [drp] | Freq: Three times a day (TID) | OPHTHALMIC | Status: DC | PRN

## 2018-08-15 MED ORDER — OXYCODONE HCL 5 MG PO TABS: 5.0000 mg | ORAL_TABLET | ORAL | Status: DC | PRN

## 2018-08-15 MED ORDER — ONDANSETRON HCL 4 MG/2ML IJ SOLN: 4.0000 mg | Freq: Four times a day (QID) | INTRAMUSCULAR | Status: DC | PRN

## 2018-08-15 MED ORDER — DIPHENHYDRAMINE HCL 50 MG/ML IJ SOLN: 12.5000 mg | Freq: Four times a day (QID) | INTRAMUSCULAR | Status: DC | PRN

## 2018-08-15 MED ORDER — PREDNISONE 20 MG PO TABS: 20.0000 mg | ORAL_TABLET | Freq: Every day | ORAL | Status: DC

## 2018-08-15 MED ORDER — DILTIAZEM HCL ER COATED BEADS 240 MG PO CP24
240.0000 mg | ORAL_CAPSULE | Freq: Every day | ORAL | Status: DC
  Administered 2018-08-15 – 2018-08-16 (×2): 240 mg via ORAL
  Filled 2018-08-15 (×2): qty 1

## 2018-08-15 MED ORDER — HYDRALAZINE HCL 20 MG/ML IJ SOLN: 10.0000 mg | INTRAMUSCULAR | Status: DC | PRN

## 2018-08-15 MED ORDER — LIDOCAINE-EPINEPHRINE (PF) 2 %-1:200000 IJ SOLN
INTRAMUSCULAR | Status: AC
  Administered 2018-08-15: 20 mL
  Filled 2018-08-15: qty 20

## 2018-08-15 MED ORDER — ACETAMINOPHEN 500 MG PO TABS
1000.0000 mg | ORAL_TABLET | Freq: Four times a day (QID) | ORAL | Status: DC
  Administered 2018-08-15 – 2018-08-16 (×4): 1000 mg via ORAL
  Filled 2018-08-15 (×4): qty 2

## 2018-08-15 MED ORDER — FUROSEMIDE 20 MG PO TABS
10.0000 mg | ORAL_TABLET | Freq: Every day | ORAL | Status: DC
  Administered 2018-08-15 – 2018-08-16 (×2): 10 mg via ORAL
  Filled 2018-08-15 (×2): qty 1

## 2018-08-15 MED ORDER — SIMETHICONE 80 MG PO CHEW: 40.0000 mg | CHEWABLE_TABLET | Freq: Four times a day (QID) | ORAL | Status: DC | PRN

## 2018-08-15 MED ORDER — LIDOCAINE-EPINEPHRINE (PF) 2 %-1:200000 IJ SOLN: 10.0000 mL | Freq: Once | INTRAMUSCULAR | Status: DC

## 2018-08-15 MED ORDER — PREDNISONE 20 MG PO TABS
40.0000 mg | ORAL_TABLET | Freq: Every day | ORAL | Status: AC
  Administered 2018-08-15 – 2018-08-16 (×2): 40 mg via ORAL
  Filled 2018-08-15 (×2): qty 2

## 2018-08-15 MED ORDER — ATORVASTATIN CALCIUM 10 MG PO TABS
20.0000 mg | ORAL_TABLET | Freq: Every day | ORAL | Status: DC
  Administered 2018-08-15 – 2018-08-16 (×2): 20 mg via ORAL
  Filled 2018-08-15 (×2): qty 2

## 2018-08-15 MED ORDER — DOCUSATE SODIUM 100 MG PO CAPS: 100.0000 mg | ORAL_CAPSULE | Freq: Every day | ORAL | Status: DC | PRN

## 2018-08-15 MED ORDER — CEPHALEXIN 250 MG PO CAPS
250.0000 mg | ORAL_CAPSULE | Freq: Four times a day (QID) | ORAL | Status: DC
  Administered 2018-08-15 – 2018-08-16 (×4): 250 mg via ORAL
  Filled 2018-08-15 (×4): qty 1

## 2018-08-15 MED ORDER — POLYETHYLENE GLYCOL 3350 17 G PO PACK: 17.0000 g | PACK | Freq: Every day | ORAL | Status: DC | PRN

## 2018-08-15 MED ORDER — ASPIRIN EC 81 MG PO TBEC
81.0000 mg | DELAYED_RELEASE_TABLET | Freq: Every day | ORAL | Status: DC
  Administered 2018-08-15 – 2018-08-16 (×2): 81 mg via ORAL
  Filled 2018-08-15 (×2): qty 1

## 2018-08-15 MED ORDER — ONDANSETRON 4 MG PO TBDP: 4.0000 mg | ORAL_TABLET | Freq: Four times a day (QID) | ORAL | Status: DC | PRN

## 2018-08-15 MED ORDER — TRIAMTERENE-HCTZ 37.5-25 MG PO TABS
1.0000 | ORAL_TABLET | Freq: Every day | ORAL | Status: DC
  Administered 2018-08-16: 1 via ORAL
  Filled 2018-08-15 (×2): qty 1

## 2018-08-15 MED ORDER — ENOXAPARIN SODIUM 30 MG/0.3ML ~~LOC~~ SOLN
30.0000 mg | SUBCUTANEOUS | Status: DC
  Administered 2018-08-16: 10:00:00 30 mg via SUBCUTANEOUS
  Filled 2018-08-15: qty 0.3

## 2018-08-15 MED ORDER — TRAMADOL HCL 50 MG PO TABS
50.0000 mg | ORAL_TABLET | Freq: Four times a day (QID) | ORAL | Status: DC | PRN
  Administered 2018-08-16: 50 mg via ORAL
  Filled 2018-08-15: qty 1

## 2018-08-15 MED ORDER — SODIUM CHLORIDE 0.9 % IV SOLN
INTRAVENOUS | Status: DC
  Administered 2018-08-15: 18:00:00 via INTRAVENOUS

## 2018-08-15 MED ORDER — FENOFIBRATE 160 MG PO TABS
80.0000 mg | ORAL_TABLET | Freq: Every day | ORAL | Status: DC
  Administered 2018-08-15 – 2018-08-16 (×2): 80 mg via ORAL
  Filled 2018-08-15 (×2): qty 1

## 2018-08-15 MED ORDER — MORPHINE SULFATE (PF) 2 MG/ML IV SOLN: 1.0000 mg | INTRAVENOUS | Status: DC | PRN

## 2018-08-15 MED ORDER — POLYVINYL ALCOHOL 1.4 % OP SOLN
1.0000 [drp] | Freq: Three times a day (TID) | OPHTHALMIC | Status: DC | PRN
  Filled 2018-08-15: qty 15

## 2018-08-15 NOTE — Progress Notes (Signed)
RT asked patient if he would like CPAP tonight. Patient stated he wears sometimes at home but he doesn't not want to wear one here. Patient on RA tolerating well.

## 2018-08-15 NOTE — ED Triage Notes (Addendum)
Pt arrives by [facility]AR. Per pt- pt endorses right flank pain that is worse with movement. Pt also states that he also has constipation since his lymph ectomy, however reports a loose stool this morning. Pt also reports stinging pain to his left axilla where he had the lymph nodes removed. Pt was worked up yesterday at [facility], has been taking steroid and muscle relaxer. Pt states after he took the muscle relaxer he experienced terrible shortness of breath and dizziness, still dizzy today.

## 2018-08-15 NOTE — Progress Notes (Signed)
Pt new admit from ED s/p seroma of skin at the right upper back with dressing dry and intact, alert and oriented, room air, covid result pending, no complain of pain at this time.

## 2018-08-15 NOTE — ED Notes (Signed)
Dr. Georgette Dover at bedside to drain wound

## 2018-08-15 NOTE — ED Provider Notes (Addendum)
[facility] EMERGENCY DEPARTMENT Provider Note   CSN: [id number] Arrival date & time: 08/15/18  0945    History   Chief Complaint Chief Complaint  Patient presents with  . Flank Pain  . Constipation    HPI Casey Rios is a 85 y.o. male.     HPI   CARL BUTNER is a 85 y.o. male, with a history of CKD, melanoma, HTN, hyperlipidemia, presenting to the ED with a primary complaint of right lower back pain present since at least July 1.  Patient also has other complaints, as detailed below.  Patient states he underwent large excision of melanoma from his back along with lymph node removal and biopsy from the left axilla June 30, performed by Dr. Donne Hazel.  1.  After the surgery, he noted pain in the right lower back and right abdomen.  As time went on, it seemed that this pain localized to the right lower back and radiated intermittently to the right abdomen.  Pain is described as a tightness, sometimes sharp, worse with movement of the right leg and with ambulation. He was evaluated for this complaint in the ED July 15 as well as July 19.  CT of the abdomen/pelvis performed July 15 was rather unremarkable for acute abnormality.  2.  Patient complains of swelling and pain to the left axilla beginning after the surgery.  This was evaluated by Dr. Donne Hazel in a postop visit in the office and patient was asked to keep him updated should the area worsen.  Patient states he has begun to have pain along with the swelling.  He has not yet followed up again with Dr. Donne Hazel on this matter.  3.  Patient has a complaint of constipation that is occurred since his surgery.  He states his bowels have not yet returned to normal.  He has been working with Dr. Donne Hazel on this and has been taking MiraLAX, which has now made his stools soft and more frequent.  4.  After patient's visit to the ED yesterday, he was prescribed Robaxin and prednisone.  He states the prednisone  seemed to improve his back pain, even after 1 dose.  Yesterday evening, he then took a dose of the Robaxin and then woke up last night with shortness of breath and sensation of throat swelling.  He states EMS was called and they told him they suspected allergic reaction.  They did not administer any medications.  Symptoms resolved on their own.  Patient denies fever/chills, cough, persistent shortness of breath, chest pain, continued abdominal pain, hematochezia/melena, urinary symptoms, falls/trauma, numbness/weakness in the extremities, swelling in the extremities, genital pain/swelling, or any other complaints.  Has had variable HR and slowing HR that has been examined by Cardiology, Dr. Felton Clinton. Scheduled for pacemaker Aug 10.  Past Medical History:  Diagnosis Date  . Arthritis   . 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 CKD (chronic kidney disease)   . Dyspnea    with exertion  . History of aortic stenosis    s/p AVR, pericardial tissue ~ 03/2002  . Hyperlipidemia   . Hypertension   . Macular degeneration   . Sleep apnea    CPAP    Patient Active Problem List   Diagnosis Date Noted  . Postoperative seroma of skin after dermatologic procedure 08/15/2018    Past Surgical History:  Procedure Laterality Date  . CARDIAC CATHETERIZATION    . CARDIAC VALVE REPLACEMENT     aortic  valve replacement, pericardial tissue ~ 03/2002  . CHOLECYSTECTOMY    . EYE SURGERY Bilateral    cateract removal  . HERNIA REPAIR    . MELANOMA EXCISION WITH SENTINEL LYMPH NODE BIOPSY N/A 07/26/2018   Procedure: BACK MELANOMA EXCISION;  Surgeon: Rolm Bookbinder, MD;  Location: [facility];  Service: General;  Laterality: N/A;  . SENTINEL NODE BIOPSY Left 07/26/2018   Procedure: LEFT SENTINEL LYMPH NODE BIOPSY;  Surgeon: Rolm Bookbinder, MD;  Location: [facility];  Service: General;  Laterality: Left;  . TONSILLECTOMY          Home Medications    Prior to Admission medications   Medication Sig Start  Date End Date Taking? Authorizing Provider  aspirin EC 81 MG tablet Take 81 mg by mouth daily.   Yes [provider]  atorvastatin (LIPITOR) 20 MG tablet Take 20 mg by mouth daily.   Yes [provider]  carboxymethylcellulose (REFRESH PLUS) 0.5 % SOLN Place 1 drop into both eyes 3 (three) times daily as needed (dry eyes).   Yes [provider]  cholecalciferol (VITAMIN D3) 25 MCG (1000 UT) tablet Take 1,000 Units by mouth daily.   Yes [provider]  diltiazem (DILACOR XR) 240 MG 24 hr capsule Take 240 mg by mouth daily.   Yes [provider]  docusate sodium (COLACE) 100 MG capsule Take 100 mg by mouth daily as needed for mild constipation.    Yes [provider]  fenofibrate 160 MG tablet Take 80 mg by mouth daily.   Yes [provider]  furosemide (LASIX) 20 MG tablet Take 10 mg by mouth daily. Take 1/2 pill once a day    Yes [provider]  Magnesium 250 MG TABS Take 250 mg by mouth daily.   Yes [provider]  Multiple Vitamins-Minerals (MULTIVITAMIN WITH MINERALS) tablet Take 1 tablet by mouth daily.   Yes [provider]  Multiple Vitamins-Minerals (PRESERVISION AREDS 2+MULTI VIT PO) Take 1 capsule by mouth 2 (two) times a day.   Yes [provider]  predniSONE (DELTASONE) 20 MG tablet Take 2 tablets (40 mg total) by mouth daily. 08/14/18  Yes Davonna Belling, MD  RABEprazole (ACIPHEX) 20 MG tablet Take 20 mg by mouth daily.   Yes [provider]  traMADol (ULTRAM) 50 MG tablet Take 1 tablet (50 mg total) by mouth every 6 (six) hours as needed. 07/26/18  Yes Rolm Bookbinder, MD  triamterene-hydrochlorothiazide (MAXZIDE-25) 37.5-25 MG tablet Take 1 tablet by mouth daily.   Yes [provider]  methocarbamol (ROBAXIN) 500 MG tablet Take 1 tablet (500 mg total) by mouth every 8 (eight) hours as needed for muscle spasms. Patient not taking: Reported on 08/15/2018 08/14/18    Davonna Belling, MD    Family History Family History  Problem Relation Age of Onset  . Diabetes Mother   . Emphysema Mother   . Cancer Father   . Heart attack Brother     Social History Social History   Tobacco Use  . Smoking status: Former Smoker    Packs/day: 2.00    Years: 20.00    Pack years: 40.00    Types: Cigarettes  . Smokeless tobacco: Never Used  Substance Use Topics  . Alcohol use: Yes    Alcohol/week: 1.0 standard drinks    Types: 1 Glasses of wine per week    Comment: daily  . Drug use: Never     Allergies   Robaxin [methocarbamol], Celebrex [celecoxib], and Loratadine  Review of Systems Review of Systems  Constitutional: Negative for chills, diaphoresis and fever.  Respiratory: Negative for cough and shortness of breath.   Cardiovascular: Negative for chest pain, palpitations and leg swelling.  Gastrointestinal: Positive for constipation. Negative for abdominal pain, blood in stool, diarrhea, nausea and vomiting.  Genitourinary: Negative for difficulty urinating, dysuria, flank pain and hematuria.  Musculoskeletal: Positive for back pain. Negative for neck pain.  Neurological: Negative for dizziness, syncope, weakness, light-headedness, numbness and headaches.  All other systems reviewed and are negative.    Physical Exam Updated Vital Signs BP 139/62   Pulse 95   Temp 98.4 F (36.9 C)   Resp (!) 21   SpO2 94%   Physical Exam Vitals signs and nursing note reviewed.  Constitutional:      General: He is not in acute distress.    Appearance: He is well-developed. He is not diaphoretic.  HENT:     Head: Normocephalic and atraumatic.     Mouth/Throat:     Mouth: Mucous membranes are moist.     Pharynx: Oropharynx is clear.  Eyes:     Conjunctiva/sclera: Conjunctivae normal.  Neck:     Musculoskeletal: Neck supple.  Cardiovascular:     Rate and Rhythm: Normal rate and regular rhythm.     Pulses: Normal pulses.          Radial pulses  are 2+ on the right side and 2+ on the left side.       Posterior tibial pulses are 2+ on the right side and 2+ on the left side.     Heart sounds: Normal heart sounds.     Comments: Tactile temperature in the extremities appropriate and equal bilaterally. Pulmonary:     Effort: Pulmonary effort is normal. No respiratory distress.     Breath sounds: Normal breath sounds.  Abdominal:     Palpations: Abdomen is soft.     Tenderness: There is no abdominal tenderness. There is no guarding.     Comments: Patient has no tenderness in the abdomen or flanks whatsoever.  Musculoskeletal:       Back:     Right lower leg: No edema.     Left lower leg: No edema.     Comments: Large area of swelling to the left axilla with tenderness and erythema.  No increased warmth.  Excision site on the upper back has some swelling and what feels like fluctuance as well as some purulence at the mid incision site.  Tenderness to the right lower back into the right buttocks.  Increased pain with active straight leg raise.  No tenderness to the lateral or anterior hip or pelvis.  Lymphadenopathy:     Cervical: No cervical adenopathy.  Skin:    General: Skin is warm and dry.     Findings: Rash present.     Comments: Patient has what may be a large urticarial rash to the torso and neck.  Lesions are somewhat raised.  They are detailed in the pictures below.  There is no tenderness or pruritus.  Neurological:     Mental Status: He is alert.     Comments: Sensation grossly intact to light touch in the extremities.  Grip strengths equal bilaterally.  Strength 5/5 in all extremities.  Ambulatory without assistance, but with slow, antalgic gait.  Coordination intact. Cranial nerves III-XII grossly intact. No facial droop.   Psychiatric:        Mood and Affect: Mood and affect normal.  Speech: Speech normal.        Behavior: Behavior normal.      Surgical incision, upper back:                  ED Treatments / Results  Labs (all labs ordered are listed, but only abnormal results are displayed) Labs Reviewed  COMPREHENSIVE METABOLIC PANEL - Abnormal; Notable for the following components:      Result Value   Glucose, Bld 157 (*)    BUN 35 (*)    Creatinine, Ser 2.32 (*)    Total Protein 6.3 (*)    GFR calc non Af Amer 24 (*)    GFR calc Af Amer 28 (*)    All other components within normal limits  CBC WITH DIFFERENTIAL/PLATELET - Abnormal; Notable for the following components:   WBC 15.6 (*)    Neutro Abs 13.4 (*)    Monocytes Absolute 1.4 (*)    All other components within normal limits  AEROBIC CULTURE (SUPERFICIAL SPECIMEN)  SARS CORONAVIRUS 2 (HOSPITAL ORDER, PERFORMED IN [facility])  URINALYSIS, ROUTINE W REFLEX MICROSCOPIC   WBC  Date Value Ref Range Status  08/15/2018 15.6 (H) 4.0 - 10.5 K/uL Final  08/10/2018 9.2 4.0 - 10.5 K/uL Final  07/21/2018 9.2 4.0 - 10.5 K/uL Final   BUN  Date Value Ref Range Status  08/15/2018 35 (H) 8 - 23 mg/dL Final  08/10/2018 34 (H) 8 - 23 mg/dL Final  07/21/2018 36 (H) 8 - 23 mg/dL Final   Creatinine, Ser  Date Value Ref Range Status  08/15/2018 2.32 (H) 0.61 - 1.24 mg/dL Final  08/10/2018 2.17 (H) 0.61 - 1.24 mg/dL Final  07/21/2018 1.83 (H) 0.61 - 1.24 mg/dL Final     EKG EKG Interpretation  Date/Time:  Monday August 15 2018 09:46:55 EDT Ventricular Rate:  95 PR Interval:    QRS Duration: 139 QT Interval:  398 QTC Calculation: 501 R Axis:   -78 Text Interpretation:  Sinus rhythm new RBBB and LAFB ST elevation, consider inferior injury Confirmed by Blanchie Dessert [phone]) on 08/15/2018 4:08:10 PM   Radiology Ct Chest Wo Contrast  Result Date: 08/15/2018 CLINICAL DATA:  Swelling in left axilla. Melanoma spots removed from upper back and left axilla. Redness and swelling and drainage in these areas. EXAM: CT CHEST WITHOUT CONTRAST TECHNIQUE: Multidetector CT imaging of the chest was performed  following the standard protocol without IV contrast. COMPARISON:  August 04, 2018 FINDINGS: Cardiovascular: Heart is normal size. Diffuse aortic calcifications. No aneurysm. Moderate coronary artery calcifications. Mediastinum/Nodes: No mediastinal, hilar, or axillary adenopathy. Lungs/Pleura: No confluent opacity, effusions or suspicious pulmonary nodules. Upper Abdomen: Imaging into the upper abdomen shows no acute findings. Musculoskeletal: Large fluid collection within the left axilla measures up to 6.8 cm. Fluid collection in the left upper posterior back measures up to 4.5 cm. These could reflect postoperative hematomas, seromas, or abscesses. No acute bony abnormality. IMPRESSION: Fluid collections within the left axilla and left upper back soft tissues. These likely reflect postoperative hematomas, seromas or abscesses. Diffuse aortic atherosclerosis, coronary artery disease. Electronically Signed   By: Rolm Baptise M.D.   On: 08/15/2018 12:53    Procedures Procedures (including critical care time)  Medications Ordered in ED Medications  lidocaine-EPINEPHrine (XYLOCAINE W/EPI) 2 %-1:200000 (PF) injection 10 mL (has no administration in time range)  predniSONE (DELTASONE) tablet 40 mg (40 mg Oral Given 08/15/18 1611)  lidocaine-EPINEPHrine (XYLOCAINE W/EPI) 2 %-1:200000 (PF) injection (20 mLs  Given  08/15/18 1611)     Initial Impression / Assessment and Plan / ED Course  I have reviewed the triage vital signs and the nursing notes.  Pertinent labs & imaging results that were available during my care of the patient were reviewed by me and considered in my medical decision making (see chart for details).  Clinical Course as of Aug 15 1611  Mon Aug 15, 2018  1433 Spoke with Janett Billow, [place] on call with general surgery. States they will come assess patient.   [[facility]J]  6659 RBBB is new, but with last tracing noted to be from 2004.  Patient has no chest pain, persistent shortness of breath, or other  symptoms concerning for ischemia.  EKG 12-Lead [[facility]J]    Clinical Course User Index [[facility]] Annalicia Renfrew C, PA-C       Patient presents with complaint of lower back pain.  No evidence of neurovascular compromise.  No red flag symptoms.  Symptoms are suggestive of possible sciatica.   He has swelling as well as drainage from his surgical site on his back.  He also has a large area of swelling in the left axilla.  Afebrile, not tachycardic, not tachypneic, not hypotensive.  However, patient does have a leukocytosis. CT of the chest shows fluid collections both in the upper back and left axilla.  Patient admitted via general surgery team for further management. COVID-19 test pending at time of admission.  Findings and plan of care discussed with Julianne Rice, MD. Dr. Lita Mains personally evaluated and examined this patient.  Vitals:   08/15/18 1445 08/15/18 1500 08/15/18 1530 08/15/18 1600  BP:  126/74 96/80 137/70  Pulse: 93  90   Resp:      Temp:      SpO2: 97%  98%      Final Clinical Impressions(s) / ED Diagnoses   Final diagnoses:  Wound drainage  Post-op pain    ED Discharge Orders    None       Layla Maw 08/15/18 1603    Lorayne Bender, PA-C 08/15/18 1613    Julianne Rice, MD 08/16/18 [phone]

## 2018-08-15 NOTE — Plan of Care (Signed)
Pt new admit alert and oriented with back dressing dry and intact, complain of back pain but refused pain med at this time.

## 2018-08-15 NOTE — H&P (Signed)
[facility] Surgery Consult/Admission Note  Casey Rios 11-Mar-1929  [id number].    Requesting Provider: Arlean Hopping, PA-C Chief Complaint/Reason for Consult: L axillary swelling s/p lymph node biopsy  HPI:   Pt is a 85 yo male with a hx of OSA, HTN, HLD, aortic stenosis s/p AVR, CKD, s/p excision of back melanoma, L axillary sentinel lymph node biopsy by Dr. Donne Hazel on 07/26/18. Pt states he spoke to Dr. Donne Hazel about the swelling in his L axilla at his post op appointment. Since his follow up he has been having sharp intermittent pains in his anterior L axilla. He does not think it is more swollen today. Pt has been  having R hip/back pain since surgery that he was seen in the ER for yesterday. This pain is sharp, worse with twisting and ambulation. He was given prednisone and robaxin. He states the steroid helped his pain but he had an allergic reaction to the robaxin to include difficulty swallowing, SOB and rash. Pt denies fever, chills, nausea, vomiting, SOB, cough or other associated symptoms.   ROS:  Review of Systems  Constitutional: Negative for chills, diaphoresis and fever.  HENT: Negative for sore throat.   Respiratory: Negative for cough and shortness of breath.   Cardiovascular: Negative for chest pain.  Gastrointestinal: Negative for abdominal pain, blood in stool, constipation, diarrhea, nausea and vomiting.  Genitourinary: Negative for dysuria.  Skin: Positive for rash.       + for Swelling and pain of L axilla  Neurological: Negative for dizziness and loss of consciousness.  All other systems reviewed and are negative.    Family History  Problem Relation Age of Onset   Diabetes Mother    Emphysema Mother    Cancer Father    Heart attack Brother     Past Medical History:  Diagnosis Date   Arthritis    Cancer ([place]) 2020   melonomia on back   CKD (chronic kidney disease)    Dyspnea    with exertion   History of aortic stenosis    s/p AVR,  pericardial tissue ~ 03/2002   Hyperlipidemia    Hypertension    Macular degeneration    Sleep apnea    CPAP    Past Surgical History:  Procedure Laterality Date   CARDIAC CATHETERIZATION     CARDIAC VALVE REPLACEMENT     aortic valve replacement, pericardial tissue ~ 03/2002   CHOLECYSTECTOMY     EYE SURGERY Bilateral    cateract removal   HERNIA REPAIR     MELANOMA EXCISION WITH SENTINEL LYMPH NODE BIOPSY N/A 07/26/2018   Procedure: BACK MELANOMA EXCISION;  Surgeon: Rolm Bookbinder, MD;  Location: [place];  Service: General;  Laterality: N/A;   SENTINEL NODE BIOPSY Left 07/26/2018   Procedure: LEFT SENTINEL LYMPH NODE BIOPSY;  Surgeon: Rolm Bookbinder, MD;  Location: [place];  Service: General;  Laterality: Left;   TONSILLECTOMY      Social History:  reports that he has quit smoking. His smoking use included cigarettes. He has a 40.00 pack-year smoking history. He has never used smokeless tobacco. He reports current alcohol use of about 1.0 standard drinks of alcohol per week. He reports that he does not use drugs.  Allergies:  Allergies  Allergen Reactions   Robaxin [Methocarbamol] Hives and Shortness Of Breath    Dizziness   Celebrex [Celecoxib] Other (See Comments)    Upsets stomach    Loratadine Nausea And Vomiting and Other (See Comments)  Other reaction(s): GI Upset (intolerance)     (Not in a hospital admission)   Blood pressure 126/74, pulse 93, temperature 98.4 F (36.9 C), resp. rate 17, SpO2 97 %.  Physical Exam Vitals signs and nursing note reviewed.  Constitutional:      General: He is not in acute distress.    Appearance: Normal appearance. He is not diaphoretic.  HENT:     Head: Normocephalic and atraumatic.     Nose: Nose normal.     Mouth/Throat:     Comments: Pt wearing mask Eyes:     General: No scleral icterus.       Right eye: No discharge.        Left eye: No discharge.     Conjunctiva/sclera: Conjunctivae normal.      Pupils: Pupils are equal, round, and reactive to light.  Neck:     Musculoskeletal: Normal range of motion and neck supple.  Cardiovascular:     Rate and Rhythm: Normal rate. Rhythm irregular.     Pulses:          Radial pulses are 2+ on the right side and 2+ on the left side.     Heart sounds: Normal heart sounds. No murmur.  Pulmonary:     Effort: Pulmonary effort is normal. No respiratory distress.     Breath sounds: Normal breath sounds. No wheezing, rhonchi or rales.       Comments: BACK: large vertical incision with 2 small openings at lower end with serosanguinous drainage, areas of fluctuance noted with mild erythema Abdominal:     General: Bowel sounds are normal. There is no distension.     Palpations: Abdomen is soft. Abdomen is not rigid.     Tenderness: There is no abdominal tenderness. There is no guarding.  Musculoskeletal: Normal range of motion.        General: No tenderness or deformity.     Right lower leg: No edema.     Left lower leg: No edema.  Lymphadenopathy:     Upper Body:     Right upper body: No axillary (no swelling or adenopathy noted) adenopathy.     Left upper body: Axillary adenopathy (swelling, redness and TTP, incision looks well healed) present.  Skin:    General: Skin is warm and dry.     Findings: Rash (hives in BL axillas, groin) present.  Neurological:     Mental Status: He is alert and oriented to person, place, and time.     Results for orders placed or performed during the hospital encounter of 08/15/18 (from the past 48 hour(s))  Comprehensive metabolic panel     Status: Abnormal   Collection Time: 08/15/18 12:03 PM  Result Value Ref Range   Sodium 138 135 - 145 mmol/L   Potassium 4.2 3.5 - 5.1 mmol/L   Chloride 103 98 - 111 mmol/L   CO2 24 22 - 32 mmol/L   Glucose, Bld 157 (H) 70 - 99 mg/dL   BUN 35 (H) 8 - 23 mg/dL   Creatinine, Ser 2.32 (H) 0.61 - 1.24 mg/dL   Calcium 9.5 8.9 - 10.3 mg/dL   Total Protein 6.3 (L) 6.5 - 8.1  g/dL   Albumin 3.7 3.5 - 5.0 g/dL   AST 19 15 - 41 U/L   ALT 14 0 - 44 U/L   Alkaline Phosphatase 45 38 - 126 U/L   Total Bilirubin 0.9 0.3 - 1.2 mg/dL   GFR calc non Af Amer 24 (L) >  60 mL/min   GFR calc Af Amer 28 (L) >60 mL/min   Anion gap 11 5 - 15    Comment: Performed at [facility] [address] [place], [place] [address]  CBC with Differential     Status: Abnormal   Collection Time: 08/15/18 12:03 PM  Result Value Ref Range   WBC 15.6 (H) 4.0 - 10.5 K/uL   RBC 4.53 4.22 - 5.81 MIL/uL   Hemoglobin 13.7 13.0 - 17.0 g/dL   HCT 41.5 39.0 - 52.0 %   MCV 91.6 80.0 - 100.0 fL   MCH 30.2 26.0 - 34.0 pg   MCHC 33.0 30.0 - 36.0 g/dL   RDW 12.9 11.5 - 15.5 %   Platelets 253 150 - 400 K/uL   nRBC 0.0 0.0 - 0.2 %   Neutrophils Relative % 86 %   Neutro Abs 13.4 (H) 1.7 - 7.7 K/uL   Lymphocytes Relative 5 %   Lymphs Abs 0.8 0.7 - 4.0 K/uL   Monocytes Relative 9 %   Monocytes Absolute 1.4 (H) 0.1 - 1.0 K/uL   Eosinophils Relative 0 %   Eosinophils Absolute 0.0 0.0 - 0.5 K/uL   Basophils Relative 0 %   Basophils Absolute 0.0 0.0 - 0.1 K/uL   Immature Granulocytes 0 %   Abs Immature Granulocytes 0.06 0.00 - 0.07 K/uL    Comment: Performed at [facility] [address], [place], [place] [address]   Ct Chest Wo Contrast  Result Date: 08/15/2018 CLINICAL DATA:  Swelling in left axilla. Melanoma spots removed from upper back and left axilla. Redness and swelling and drainage in these areas. EXAM: CT CHEST WITHOUT CONTRAST TECHNIQUE: Multidetector CT imaging of the chest was performed following the standard protocol without IV contrast. COMPARISON:  August 04, 2018 FINDINGS: Cardiovascular: Heart is normal size. Diffuse aortic calcifications. No aneurysm. Moderate coronary artery calcifications. Mediastinum/Nodes: No mediastinal, hilar, or axillary adenopathy. Lungs/Pleura: No confluent opacity, effusions or suspicious pulmonary nodules. Upper Abdomen: Imaging into the upper  abdomen shows no acute findings. Musculoskeletal: Large fluid collection within the left axilla measures up to 6.8 cm. Fluid collection in the left upper posterior back measures up to 4.5 cm. These could reflect postoperative hematomas, seromas, or abscesses. No acute bony abnormality. IMPRESSION: Fluid collections within the left axilla and left upper back soft tissues. These likely reflect postoperative hematomas, seromas or abscesses. Diffuse aortic atherosclerosis, coronary artery disease. Electronically Signed   By: Rolm Baptise M.D.   On: 08/15/2018 12:53      Assessment/Plan Active Problems:   * No active hospital problems. *  HTN OSA - CPAP HTN HLD aortic stenosis s/p AVR   s/p excision of back melanoma, L axillary sentinel lymph node biopsy by Dr. Donne Hazel on 07/26/18. Acute on CKD - IVF L axillary seroma - Dr. Georgette Dover was able to pull off 100cc of serosanguinous fluid, area was less firm - dry gauze to puncture sites Back incision - looks more like underlying seroma than infection but will still start Keflex - dry dressings daily Hives - prednisone and benadryl Back/hip pain - prednisone and   FEN: reg diet VTE: SCD's, lovenox ID: Keflex Foley: none Follow up: Dr. Donne Hazel  Plan: admit for observation, IVF, IV abx, am labs   Kalman Drape, [facility] Surgery 08/15/2018, 3:09 PM Pager: [phone] Consults: [phone] Mon-Fri 7:00 am-4:30 pm Sat-Sun 7:00 am-11:30 am

## 2018-08-15 NOTE — ED Notes (Signed)
Applied dry dressing to pt's back

## 2018-08-16 DIAGNOSIS — L7633 Postprocedural seroma of skin and subcutaneous tissue following a dermatologic procedure: Secondary | ICD-10-CM | POA: Diagnosis not present

## 2018-08-16 LAB — BASIC METABOLIC PANEL
Anion gap: 8 (ref 5–15)
BUN: 38 mg/dL — ABNORMAL HIGH (ref 8–23)
CO2: 25 mmol/L (ref 22–32)
Calcium: 9.2 mg/dL (ref 8.9–10.3)
Chloride: 105 mmol/L (ref 98–111)
Creatinine, Ser: 2.03 mg/dL — ABNORMAL HIGH (ref 0.61–1.24)
GFR calc Af Amer: 33 mL/min — ABNORMAL LOW (ref >60)
GFR calc non Af Amer: 29 mL/min — ABNORMAL LOW (ref >60)
Glucose, Bld: 166 mg/dL — ABNORMAL HIGH (ref 70–99)
Potassium: 4.3 mmol/L (ref 3.5–5.1)
Sodium: 138 mmol/L (ref 135–145)

## 2018-08-16 LAB — CBC
HCT: 37.5 % — ABNORMAL LOW (ref 39.0–52.0)
Hemoglobin: 12.5 g/dL — ABNORMAL LOW (ref 13.0–17.0)
MCH: 30.8 pg (ref 26.0–34.0)
MCHC: 33.3 g/dL (ref 30.0–36.0)
MCV: 92.4 fL (ref 80.0–100.0)
Platelets: 171 10*3/uL (ref 150–400)
RBC: 4.06 MIL/uL — ABNORMAL LOW (ref 4.22–5.81)
RDW: 13 % (ref 11.5–15.5)
WBC: 10.5 10*3/uL (ref 4.0–10.5)
nRBC: 0 % (ref 0.0–0.2)

## 2018-08-16 MED ORDER — CEPHALEXIN 250 MG PO CAPS: 250.0000 mg | ORAL_CAPSULE | Freq: Four times a day (QID) | ORAL | 0 refills | Status: AC

## 2018-08-16 NOTE — Discharge Summary (Signed)
[place] Surgery/Trauma Discharge Summary   Patient ID: Casey Rios MRN: [id number] DOB/AGE: 1930-01-17 85 y.o.  Admit date: 08/15/2018 Discharge date: 08/16/2018  Admitting Diagnosis: Post op wound complication L axillary seroma Allergic reaction to Robaxin  Discharge Diagnosis Patient Active Problem List   Diagnosis Date Noted  . Postoperative seroma of skin after dermatologic procedure 08/15/2018    Consultants none  Imaging: Ct Chest Wo Contrast  Result Date: 08/15/2018 CLINICAL DATA:  Swelling in left axilla. Melanoma spots removed from upper back and left axilla. Redness and swelling and drainage in these areas. EXAM: CT CHEST WITHOUT CONTRAST TECHNIQUE: Multidetector CT imaging of the chest was performed following the standard protocol without IV contrast. COMPARISON:  August 04, 2018 FINDINGS: Cardiovascular: Heart is normal size. Diffuse aortic calcifications. No aneurysm. Moderate coronary artery calcifications. Mediastinum/Nodes: No mediastinal, hilar, or axillary adenopathy. Lungs/Pleura: No confluent opacity, effusions or suspicious pulmonary nodules. Upper Abdomen: Imaging into the upper abdomen shows no acute findings. Musculoskeletal: Large fluid collection within the left axilla measures up to 6.8 cm. Fluid collection in the left upper posterior back measures up to 4.5 cm. These could reflect postoperative hematomas, seromas, or abscesses. No acute bony abnormality. IMPRESSION: Fluid collections within the left axilla and left upper back soft tissues. These likely reflect postoperative hematomas, seromas or abscesses. Diffuse aortic atherosclerosis, coronary artery disease. Electronically Signed   By: Rolm Baptise M.D.   On: 08/15/2018 12:53    Procedures Dr. Georgette Dover, 08/15/18, aspiration of L axillary seroma  HPI: Pt is a 85 yo male with a hx of OSA, HTN, HLD, aortic stenosis s/p AVR, CKD, s/p excision of back melanoma, L axillary sentinel lymph node biopsy by  Dr. Donne Hazel on 07/26/18. Pt states he spoke to Dr. Donne Hazel about the swelling in his L axilla at his post op appointment. Since his follow up he has been having sharp intermittent pains in his anterior L axilla. He does not think it is more swollen today. Pt has been  having R hip/back pain since surgery that he was seen in the ER for yesterday. This pain is sharp, worse with twisting and ambulation. He was given prednisone and robaxin. He states the steroid helped his pain but he had an allergic reaction to the robaxin to include difficulty swallowing, SOB and rash. Pt denies fever, chills, nausea, vomiting, cough or other associated symptoms.   Hospital Course:  Workup showed L axillary seroma, AKI, leukocytosis and hives.  Patient was admitted for observation. Pt was placed on PO Keflex, IVF and continued his prednisone. Repeat labs showed resolved leukocytosis and improved creatinine. VSS. Pt's L axillary pain resolved and his R hip/back pain improved. Pt has been instructed to follow up with his orthopedic specialist regarding his R hip/back pain. He has also been instructed on how to taper the prednisone. Pt will be discharged on 5 days of Keflex. He is complaining of constipation since his melanoma excision. We discussed a bowel regiment. On 07/21, the patient was voiding well, tolerating diet, ambulating well, pain well controlled, vital signs stable, wounds stable and felt stable for discharge home.  Patient will follow up as outlined below and knows to call with questions or concerns.     Patient was discharged in good condition.  Physical Exam: General:  Alert, NAD, pleasant, cooperative Cardio: irregular rhythm, regular rate, S1 & S2 normal, no murmur, rubs, gallops Resp: Effort normal, lungs CTA bilaterally, no wheezes, rales, rhonchi Skin: warm and dry, L axillary seroma is  soft, nontender and erythema improved. Back wound still with 2 small openings at inferior aspect with  serosanguinous drainage with scattered areas of fluctuance and mild erythema. Hives improved and almost gone.  Neuro: alert and oriented, moves all 4's  Allergies as of 08/16/2018      Reactions   Robaxin [methocarbamol] Hives, Shortness Of Breath   Dizziness   Celebrex [celecoxib] Other (See Comments)   Upsets stomach    Loratadine Nausea And Vomiting, Other (See Comments)   Other reaction(s): GI Upset (intolerance)      Medication List    STOP taking these medications   methocarbamol 500 MG tablet Commonly known as: ROBAXIN   predniSONE 20 MG tablet Commonly known as: DELTASONE     TAKE these medications   aspirin EC 81 MG tablet Take 81 mg by mouth daily.   atorvastatin 20 MG tablet Commonly known as: LIPITOR Take 20 mg by mouth daily.   carboxymethylcellulose 0.5 % Soln Commonly known as: REFRESH PLUS Place 1 drop into both eyes 3 (three) times daily as needed (dry eyes).   cephALEXin 250 MG capsule Commonly known as: KEFLEX Take 1 capsule (250 mg total) by mouth every 6 (six) hours for 5 days.   cholecalciferol 25 MCG (1000 UT) tablet Commonly known as: VITAMIN D3 Take 1,000 Units by mouth daily.   diltiazem 240 MG 24 hr capsule Commonly known as: DILACOR XR Take 240 mg by mouth daily.   docusate sodium 100 MG capsule Commonly known as: COLACE Take 100 mg by mouth daily as needed for mild constipation.   fenofibrate 160 MG tablet Take 80 mg by mouth daily.   furosemide 20 MG tablet Commonly known as: LASIX Take 10 mg by mouth daily. Take 1/2 pill once a day   Magnesium 250 MG Tabs Take 250 mg by mouth daily.   multivitamin with minerals tablet Take 1 tablet by mouth daily.   PRESERVISION AREDS 2+MULTI VIT PO Take 1 capsule by mouth 2 (two) times a day.   RABEprazole 20 MG tablet Commonly known as: ACIPHEX Take 20 mg by mouth daily.   traMADol 50 MG tablet Commonly known as: ULTRAM Take 1 tablet (50 mg total) by mouth every 6 (six) hours as  needed.   triamterene-hydrochlorothiazide 37.5-25 MG tablet Commonly known as: MAXZIDE-25 Take 1 tablet by mouth daily.        Follow-up Information    Rolm Bookbinder, MD Follow up on 08/26/2018.   Specialty: General Surgery Why: at 11:50am for follow up Contact information: [address] [place] [address] [phone]           Signed: [facility] 08/16/2018, 10:27 AM Pager: [phone] Consults: [phone] Mon-Fri 7:00 am-4:30 pm Sat-Sun 7:00 am-11:30 am

## 2018-08-16 NOTE — Discharge Instructions (Signed)
Cover your back wound with dry gauze daily after showering.   Take miralax once daily for constipation. If you are not having relief from constipation, take 1/2 bottle of magnesium citrate.  Take 20mg  of prednisone on 07/22 and 07/23 at breakfast with food, do not exceed 20mg  a day. Then take 10mg  or half a pill on 07/24. Do not take any additional prednisone after this day. Follow up with your orthopedic specialist regarding your right hip/back pain.  Take the antibiotic as prescribed. Follow up with Dr. Donne Hazel on the 31st.   Call [facility] with any questions or concerns [phone]

## 2018-08-16 NOTE — Progress Notes (Signed)
Dressing on the back and left axilla done. Sites are clean and dry. Discharged home today, patient's wife if driving him home.Discharged instructions,personal belongings given to patient. Advised to pick up medications called in at pharmacy of choice. Verbalized understanding of instructions

## 2018-08-16 NOTE — Plan of Care (Signed)

## 2018-08-17 LAB — AEROBIC CULTURE W GRAM STAIN (SUPERFICIAL SPECIMEN)

## 2018-09-05 HISTORY — PX: ATRIAL CARDIAC PACEMAKER INSERTION: SHX561

## 2021-06-07 IMAGING — CT CT CHEST WITHOUT CONTRAST
2 of 3 series · 15 of 36 positions shown, 18 images · non-contrast
Comparison: August 04, 2018

CLINICAL DATA: Swelling in left axilla. Melanoma spots removed from
upper back and left axilla. Redness and swelling and drainage in
these areas.

EXAM:
CT CHEST WITHOUT CONTRAST
TECHNIQUE: Multidetector CT imaging of the chest was performed following the
standard protocol without IV contrast.

[Series 3: thorax 5.0 i31f 2 · axial · 0.85mm/px · z∈[+1239,+1529]mm · 12 of 70 slices shown, 15 images]
[im 6/70  mediastinal]
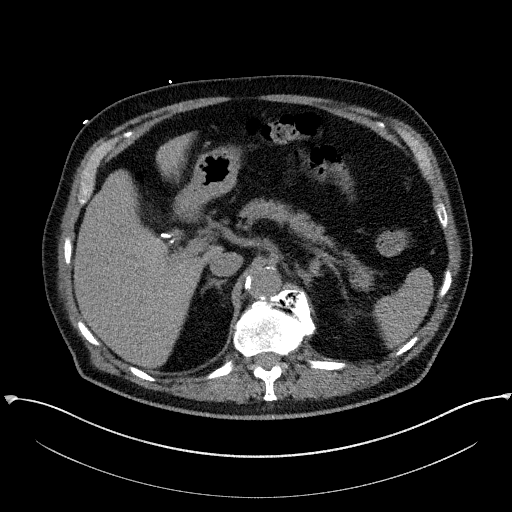
[im 6/70  lung]
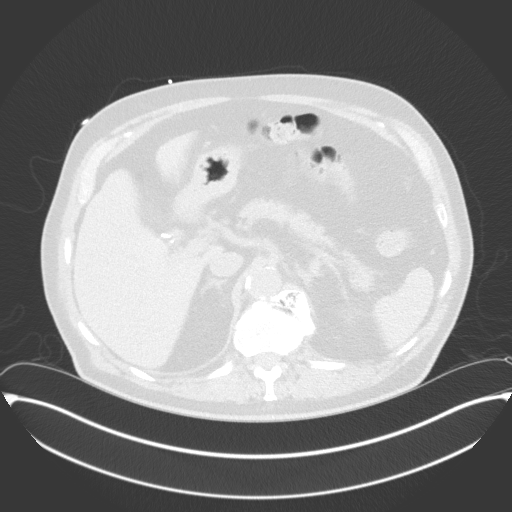
[im 11/70  lung]
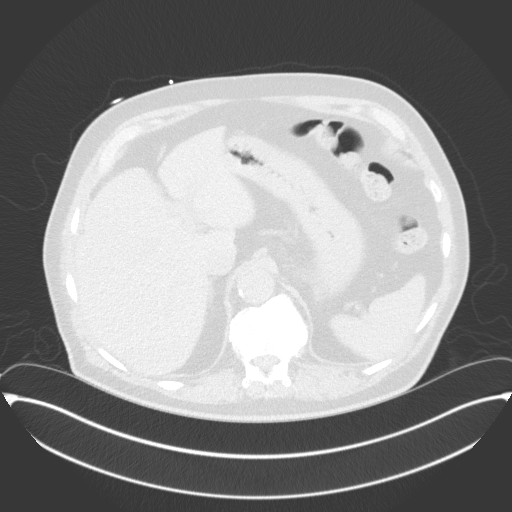
[im 16/70  lung]
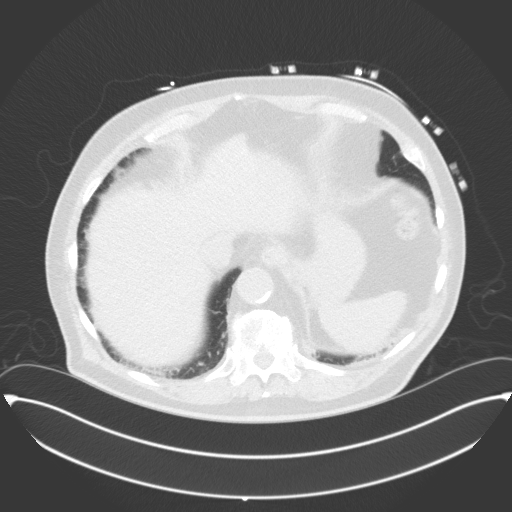
[im 21/70  lung]
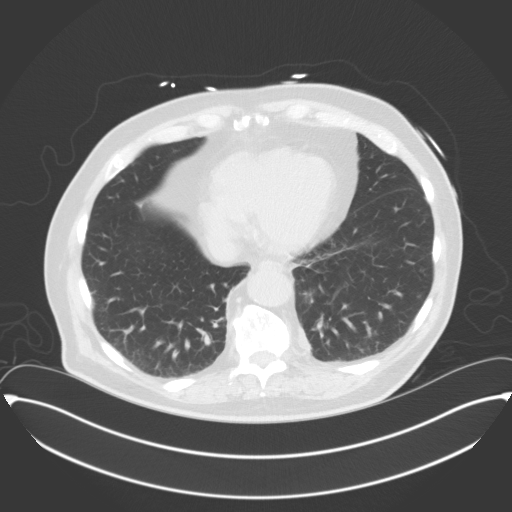
[im 26/70  mediastinal]
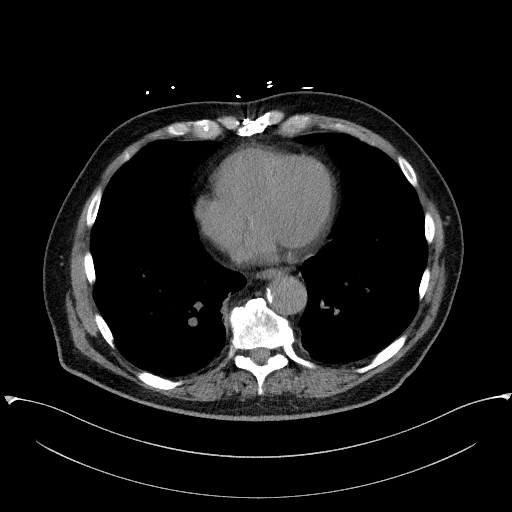
[im 26/70  lung]
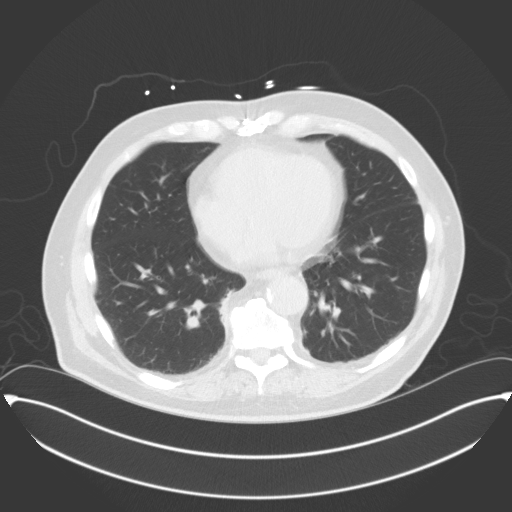
[im 31/70  lung]
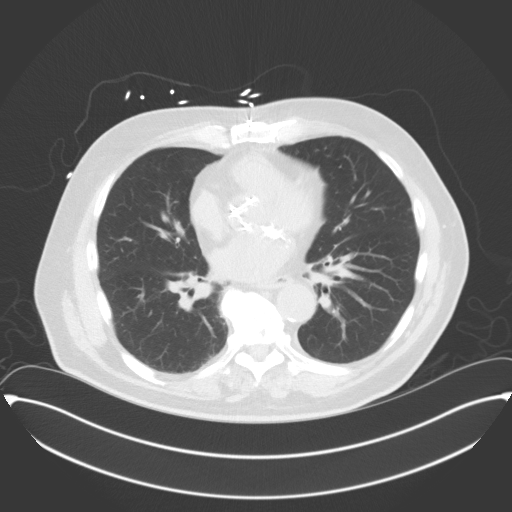
[im 39/70  lung]
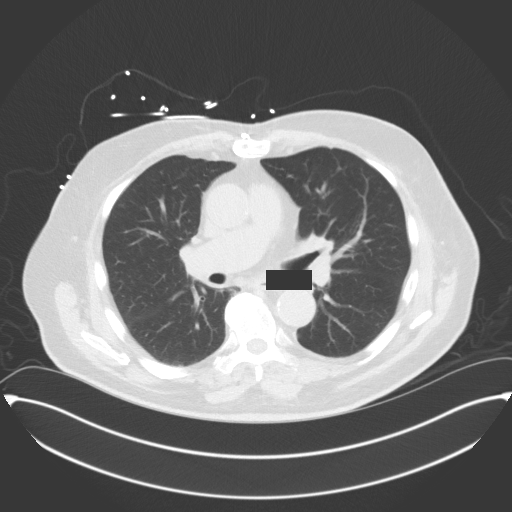
[im 44/70  lung]
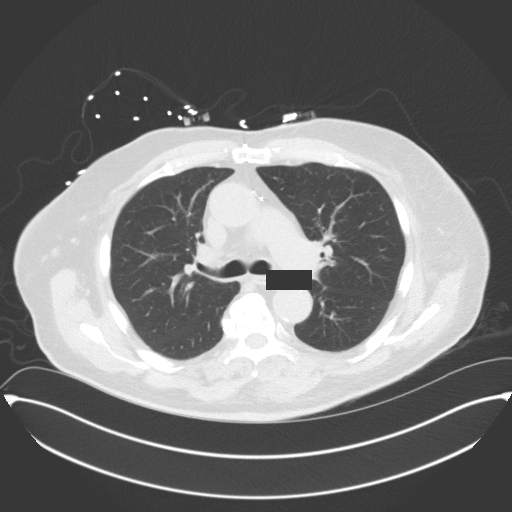
[im 49/70  mediastinal]
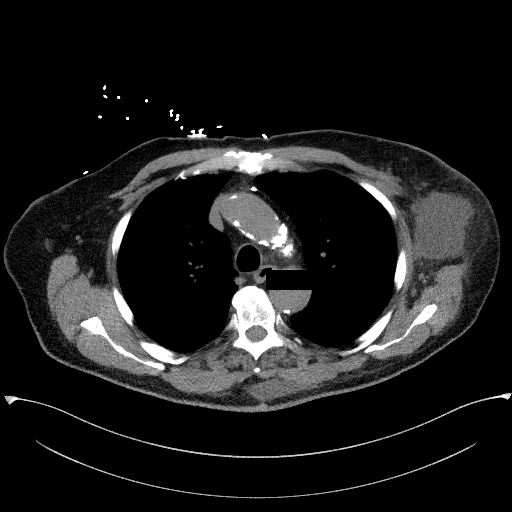
[im 49/70  lung]
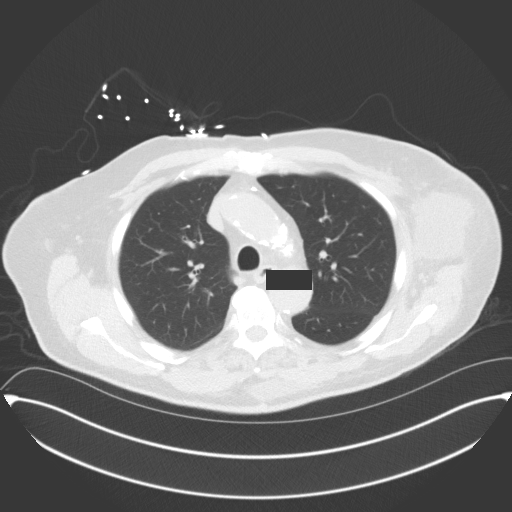
[im 54/70  lung]
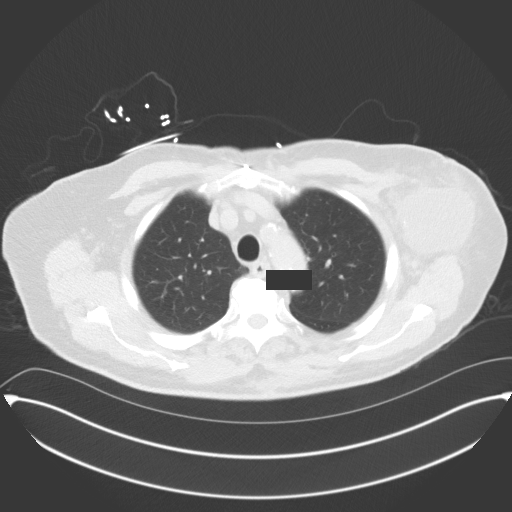
[im 59/70  lung]
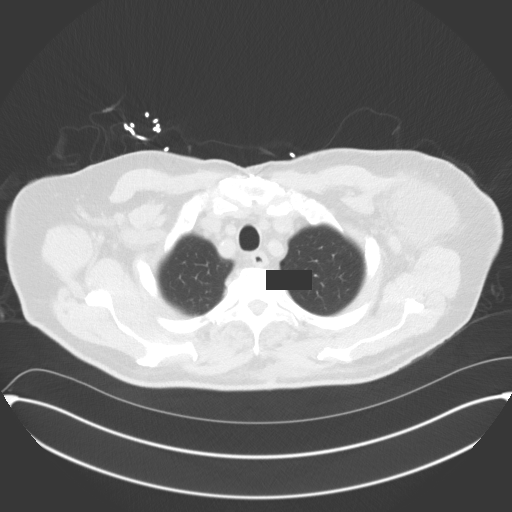
[im 64/70  lung]
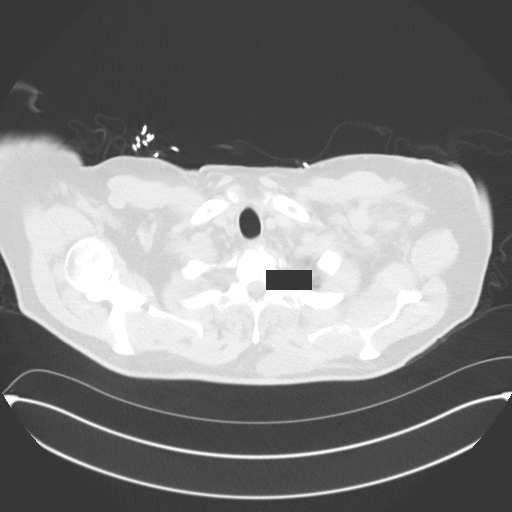

[Series 6: coronal · coronal · 0.73mm/px · 3 of 93 slices shown]
[im 19/93  lung]
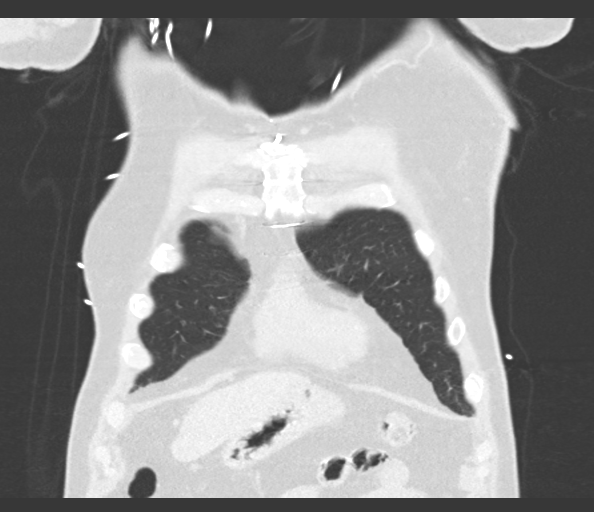
[im 37/93  lung]
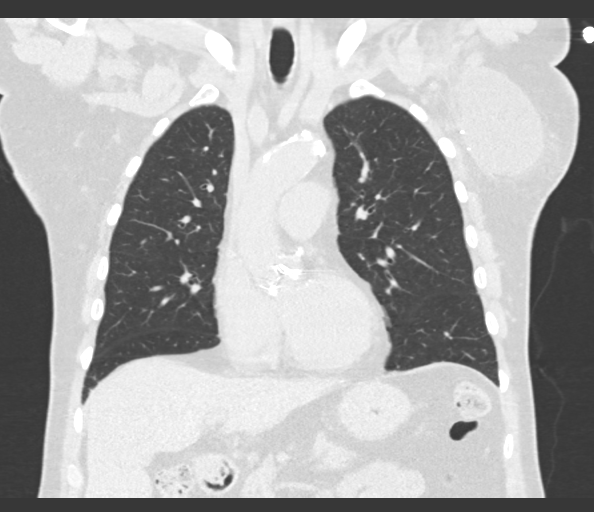
[im 56/93  lung]
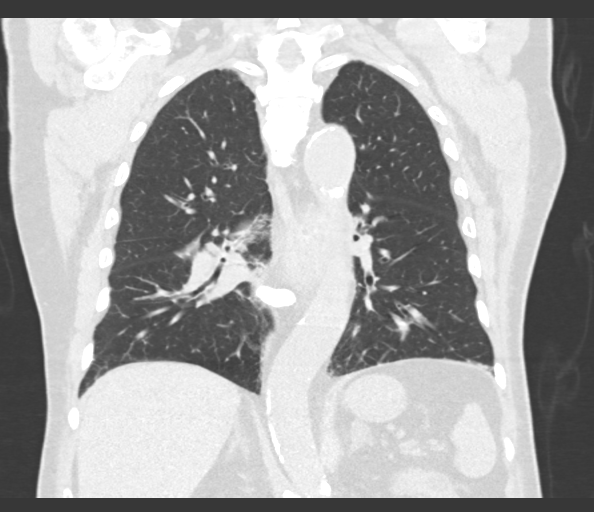

[15 of 36 positions shown; findings below may reference images not displayed]

FINDINGS: Cardiovascular: Heart is normal size. Diffuse aortic calcifications.
No aneurysm. Moderate coronary artery calcifications.

Mediastinum/Nodes: No mediastinal, hilar, or axillary adenopathy.

Lungs/Pleura: No confluent opacity, effusions or suspicious
pulmonary nodules.

Upper Abdomen: Imaging into the upper abdomen shows no acute
findings.

Musculoskeletal: Large fluid collection within the left axilla
measures up to 6.8 cm. Fluid collection in the left upper posterior
back measures up to 4.5 cm. These could reflect postoperative
hematomas, seromas, or abscesses. No acute bony abnormality.
IMPRESSION: Fluid collections within the left axilla and left upper back soft
tissues. These likely reflect postoperative hematomas, seromas or
abscesses.

Diffuse aortic atherosclerosis, coronary artery disease.

## 2022-03-27 ENCOUNTER — Other Ambulatory Visit (HOSPITAL_COMMUNITY): Payer: Self-pay | Admitting: Family Medicine

## 2022-03-27 ENCOUNTER — Other Ambulatory Visit: Payer: Self-pay

## 2022-03-27 DIAGNOSIS — M5459 Other low back pain: Secondary | ICD-10-CM

## 2022-05-20 ENCOUNTER — Ambulatory Visit (HOSPITAL_COMMUNITY)
Admission: RE | Admit: 2022-05-20 | Discharge: 2022-05-20 | Disposition: A | Discharge status: Home / Self Care | Payer: Medicare Other | Source: Ambulatory Visit | Attending: Family Medicine | Admitting: Family Medicine

## 2022-05-20 DIAGNOSIS — Z950 Presence of cardiac pacemaker: Secondary | ICD-10-CM | POA: Insufficient documentation

## 2022-05-20 DIAGNOSIS — M5459 Other low back pain: Secondary | ICD-10-CM | POA: Insufficient documentation

## 2022-06-16 ENCOUNTER — Other Ambulatory Visit (HOSPITAL_COMMUNITY): Payer: Self-pay | Admitting: Physical Medicine and Rehabilitation

## 2022-06-16 DIAGNOSIS — M5459 Other low back pain: Secondary | ICD-10-CM

## 2022-07-29 ENCOUNTER — Encounter: Payer: Self-pay | Admitting: *Deleted

## 2022-08-03 ENCOUNTER — Ambulatory Visit (INDEPENDENT_AMBULATORY_CARE_PROVIDER_SITE_OTHER): Payer: Medicare Other | Admitting: Neurology

## 2022-08-03 ENCOUNTER — Encounter: Payer: Self-pay | Admitting: Neurology

## 2022-08-03 VITALS — BP 112/64 | HR 77 | Ht 70.0 in | Wt 196.0 lb

## 2022-08-03 DIAGNOSIS — G4733 Obstructive sleep apnea (adult) (pediatric): Secondary | ICD-10-CM | POA: Diagnosis not present

## 2022-08-03 NOTE — Patient Instructions (Signed)
Please continue using your autoPAP regularly. While your insurance requires that you use PAP at least 4 hours each night on 70% of the nights, I recommend, that you not skip any nights and use it throughout the night if you can. Getting used to PAP and staying with the treatment long term does take time and patience and discipline. Untreated obstructive sleep apnea when it is moderate to severe can have an adverse impact on cardiovascular health and raise her risk for heart disease, arrhythmias, hypertension, congestive heart failure, stroke and diabetes. Untreated obstructive sleep apnea causes sleep disruption, nonrestorative sleep, and sleep deprivation. This can have an impact on your day to day functioning and cause daytime sleepiness and impairment of cognitive function, memory loss, mood disturbance, and problems focussing. Using PAP regularly can improve these symptoms.   We can see you in 1 year, you can see one of our nurse practitioners as you are stable. 

## 2022-08-03 NOTE — Progress Notes (Signed)
Subjective:    Patient ID: Casey Rios is a 89 y.o. male.  HPI    Casey Foley, MD, PhD [facility] [address] [address] [place], [place] [address]  Dear Dr. Jobe Marker,   I saw your patient, Casey Rios, upon your kind request in my sleep clinic today for initial consultation of his sleep disorder, in particular, evaluation of his obstructive sleep apnea for transfer of care.  The patient is unaccompanied today.  As you know, Casey Rios is a 89 year old male with an underlying medical history of hypertension, hyperlipidemia, melanoma, chronic kidney disease, aortic stenosis with status post aortic valve replacement with a bovine valve in 2004, status post pacemaker placement in 2020, macular degeneration, coronary artery disease, arthritis, and obesity, who was previously diagnosed with obstructive sleep apnea and placed on PAP therapy.  His Epworth sleepiness score is 9 out of 24, fatigue severity score is 43 out of 63 today.   A home sleep test in 2016 reportedly showed an AHI of 19.7/h, O2 nadir 79%.  He has been compliant with PAP therapy.  I reviewed your office note from 06/12/2022.  I was able to review his AutoPap compliance data from the past 3 months in the past month.  He has been compliant with treatment, in the past month, between 07/04/2022 and 08/02/2022 he used his machine 30 out of 30 days with percent use days greater than 4 hours at 100%, average usage of 9 hours and 16 minutes, residual AHI mildly elevated at 7.7/h, primarily secondary to central events at 4.7/h, leak on the higher side with the 95th percentile at 30.5 L/min, pressure settings of 5 to 15 cm with EPR of 1.  Average pressure for the 95th percentile at 10.5 cm, maximum of 12.2 cm.  In the past 90 days, his AHI was 11.8/h, central apnea index 7.7/h.  The patient brought a copy of his home sleep test from 04/10/2020.  He had an [employer].  AHI was 21.8/h, O2 nadir 88% with  an average oxygen saturation of 95%.  Mild snoring was observed.  He did not have any central events.  He received his current AutoPap machine on 05/16/2021.  He has a [employer]Med air sense 11 AutoSet machine and his DME provider is [facility].  He does not like using the AutoPap and has been compliant with treatment.  He is typically in bed by 9 or 10 and rise time is around 5 or 6.  He has nocturia about 2-3 times per average night, denies recurrent nocturnal or morning headaches.  He drinks decaf coffee in the morning, 1 or 2 cups, wine daily, about 2 glasses/day, quit smoking some 40 years ago.  He lives with his wife, he has 1 grown daughter and 1 grown son, his son has 2 children, a son and a daughter.  Patient had a tonsillectomy as a child.  Weight has been more or less stable.  He is up-to-date with his supplies but has not changed his filter on a monthly basis he admits.  His Past Medical History Is Significant For: Past Medical History:  Diagnosis Date   Arthritis    CAD (coronary artery disease)    Cancer (HCC) 2020   melonomia on back   CKD (chronic kidney disease)    Dyspnea    with exertion   History of aortic stenosis    s/p AVR, pericardial tissue ~ 03/2002   Hyperlipidemia    Hypertension  Macular degeneration    Sleep apnea    CPAP    His Past Surgical History Is Significant For: Past Surgical History:  Procedure Laterality Date   ATRIAL CARDIAC PACEMAKER INSERTION  09/05/2018   CARDIAC CATHETERIZATION     CARDIAC VALVE REPLACEMENT     aortic valve replacement, pericardial tissue ~ 03/2002   CHOLECYSTECTOMY     EYE SURGERY Bilateral    cateract removal   HERNIA REPAIR     MELANOMA EXCISION WITH SENTINEL LYMPH NODE BIOPSY N/A 07/26/2018   Procedure: BACK MELANOMA EXCISION;  Surgeon: Emelia Loron, MD;  Location: [facility] OR;  Service: General;  Laterality: N/A;   SENTINEL NODE BIOPSY Left 07/26/2018   Procedure: LEFT SENTINEL LYMPH NODE BIOPSY;  Surgeon: Emelia Loron, MD;  Location: [facility] OR;  Service: General;  Laterality: Left;   TONSILLECTOMY      His Family History Is Significant For: Family History  Problem Relation Age of Onset   Diabetes Mother    Emphysema Mother    Cancer Father    Heart attack Brother     His Social History Is Significant For: Social History   Socioeconomic History   Marital status: Married    Spouse name: Not on file   Number of children: Not on file   Years of education: Not on file   Highest education level: Not on file  Occupational History   Not on file  Tobacco Use   Smoking status: Former    Packs/day: 2.00    Years: 20.00    Additional pack years: 0.00    Total pack years: 40.00    Types: Cigarettes   Smokeless tobacco: Never  Vaping Use   Vaping Use: Never used  Substance and Sexual Activity   Alcohol use: Yes    Alcohol/week: 1.0 standard drink of alcohol    Types: 1 Glasses of wine per week    Comment: daily   Drug use: Never   Sexual activity: Not on file  Other Topics Concern   Not on file  Social History Narrative   Not on file   Social Determinants of Health   Financial Resource Strain: Not on file  Food Insecurity: Not on file  Transportation Needs: Not on file  Physical Activity: Not on file  Stress: Not on file  Social Connections: Not on file    His Allergies Are:  Allergies  Allergen Reactions   Robaxin [Methocarbamol] Hives and Shortness Of Breath    Dizziness   Celebrex [Celecoxib] Other (See Comments)    Upsets stomach    Loratadine Nausea And Vomiting and Other (See Comments)    Other reaction(s): GI Upset (intolerance)   :   His Current Medications Are:  Outpatient Encounter Medications as of 08/03/2022  Medication Sig   alendronate (FOSAMAX) 70 MG tablet Take 70 mg by mouth once a week.   amLODipine (NORVASC) 5 MG tablet Take 5 mg by mouth daily.   atorvastatin (LIPITOR) 20 MG tablet Take 20 mg by mouth daily.   buprenorphine (BUTRANS) 7.5 MCG/HR     carboxymethylcellulose (REFRESH PLUS) 0.5 % SOLN Place 1 drop into both eyes 3 (three) times daily as needed (dry eyes).   cholecalciferol (VITAMIN D3) 25 MCG (1000 UT) tablet Take 1,000 Units by mouth daily.   diltiazem (DILACOR XR) 240 MG 24 hr capsule Take 240 mg by mouth daily.   ELIQUIS 2.5 MG TABS tablet Take 2.5 mg by mouth 2 (two) times daily.   fenofibrate 160  MG tablet Take 80 mg by mouth daily.   furosemide (LASIX) 20 MG tablet Take 10 mg by mouth daily. Take 1/2 pill once a day    Multiple Vitamins-Minerals (PRESERVISION AREDS 2+MULTI VIT PO) Take 1 capsule by mouth 2 (two) times a day.   RABEprazole (ACIPHEX) 20 MG tablet Take 20 mg by mouth daily.   triamterene-hydrochlorothiazide (MAXZIDE-25) 37.5-25 MG tablet Take 1 tablet by mouth daily.   [DISCONTINUED] aspirin EC 81 MG tablet Take 81 mg by mouth daily. (Patient not taking: Reported on 08/03/2022)   [DISCONTINUED] docusate sodium (COLACE) 100 MG capsule Take 100 mg by mouth daily as needed for mild constipation.  (Patient not taking: Reported on 08/03/2022)   [DISCONTINUED] Magnesium 250 MG TABS Take 250 mg by mouth daily. (Patient not taking: Reported on 08/03/2022)   [DISCONTINUED] Multiple Vitamins-Minerals (MULTIVITAMIN WITH MINERALS) tablet Take 1 tablet by mouth daily. (Patient not taking: Reported on 08/03/2022)   [DISCONTINUED] traMADol (ULTRAM) 50 MG tablet Take 1 tablet (50 mg total) by mouth every 6 (six) hours as needed. (Patient not taking: Reported on 08/03/2022)   No facility-administered encounter medications on file as of 08/03/2022.  :   Review of Systems:  Out of a complete 14 point review of systems, all are reviewed and negative with the exception of these symptoms as listed below:  Review of Systems  Neurological:        Rm 9 alone Pt is well, reports he has been on CPAP for about 5 yrs. Recent SS about 2 yrs ago    Objective:  Neurological Exam  Physical Exam Physical Examination:   Vitals:   08/03/22  0834  BP: 112/64  Pulse: 77    General Examination: The patient is a very pleasant 88 y.o. male in no acute distress. He appears well-developed and well-nourished and well groomed.   HEENT: Normocephalic, atraumatic, pupils are equal, round and reactive to light, corrective eyeglasses in place.  Extraocular tracking is good without limitation to gaze excursion or nystagmus noted. Hearing is grossly intact. Face is symmetric with normal facial animation. Speech is clear with no dysarthria noted. There is no hypophonia. There is no lip, neck/head, jaw or voice tremor. Neck is supple with full range of passive and active motion. There are no carotid bruits on auscultation. Oropharynx exam reveals: mild mouth dryness, adequate dental hygiene and mild airway crowding, due to redundant soft palate, slightly wider uvula.  Mallampati class I.  Tonsils absent.  Neck circumference 16-1/2 inches.  Tongue protrudes centrally and palate elevates symmetrically.  Chest: Clear to auscultation without wheezing, rhonchi or crackles noted.  Heart: S1+S2+0, regular and normal without murmurs, rubs or gallops noted.   Abdomen: Soft, non-tender and non-distended.  Extremities: There is obvious swelling in the distal lower extremities bilaterally.   Skin: Warm and dry without trophic changes noted.   Musculoskeletal: exam reveals no obvious joint deformities.   Neurologically:  Mental status: The patient is awake, alert and oriented in all 4 spheres. His immediate and remote memory, attention, language skills and fund of knowledge are appropriate. There is no evidence of aphasia, agnosia, apraxia or anomia. Speech is clear with normal prosody and enunciation. Thought process is linear. Mood is normal and affect is normal.  Cranial nerves II - XII are as described above under HEENT exam.  Motor exam: Normal bulk, strength and tone is noted. There is no obvious action or resting tremor.  Fine motor skills and  coordination: grossly intact.  Cerebellar  testing: No dysmetria or intention tremor. There is no truncal or gait ataxia.  Sensory exam: intact to light touch in the upper and lower extremities.  Gait, station and balance: He stands easily. No veering to one side is noted. No leaning to one side is noted. Posture is age-appropriate and stance is narrow based. Gait shows normal stride length and normal pace. No problems turning are noted.   Assessment and Plan:  In summary, SAMMUEL KICHLINE is a very pleasant 89 y.o.-year old male with an underlying medical history of hypertension, hyperlipidemia, melanoma, chronic kidney disease, aortic stenosis with status post aortic valve replacement with a bovine valve in 2004, status post pacemaker placement in 2020, macular degeneration, coronary artery disease, arthritis, and obesity, who presents for evaluation of his sleep apnea, for transfer of care.  He was diagnosed with moderate obstructive sleep apnea originally in 2016 and had a repeat home sleep test in 2022.  He has been on AutoPap therapy and is compliant with treatment.  Settings are adequate, he does have a mild increase in central events but lately his central apnea index has decreased.  Advised the patient that we could potentially reduce maximum pressure setting or even keep him on a set pressure but we mutually agreed that he can continue with the current settings as he is adapted well to the treatment.  He is commended for his treatment adherence and advised to follow-up routinely in sleep clinic to see one of our nurse practitioners in 1 year.  He is reminded to change his pillow inside on a regular basis as well as his filter monthly.  He endorses using the humidifier with distilled water only.  I answered all his questions today, we reviewed his compliance data in detail and also reviewed his home sleep test results from 2022.  He was in agreement with our plan.   Thank you very much for allowing me  to participate in the care of this nice patient. If I can be of any further assistance to you please do not hesitate to call me at [phone].  Sincerely,   Casey Foley, MD, PhD

## 2022-08-24 ENCOUNTER — Ambulatory Visit (HOSPITAL_COMMUNITY)
Admission: RE | Admit: 2022-08-24 | Discharge: 2022-08-24 | Disposition: A | Discharge status: Home / Self Care | Payer: Medicare Other | Source: Ambulatory Visit | Attending: Physical Medicine and Rehabilitation | Admitting: Physical Medicine and Rehabilitation

## 2022-08-24 ENCOUNTER — Ambulatory Visit (HOSPITAL_COMMUNITY)
Admission: RE | Admit: 2022-08-24 | Discharge: 2022-08-24 | Disposition: A | Discharge status: Home / Self Care | Payer: Medicare Other | Source: Ambulatory Visit | Attending: Student | Admitting: Student

## 2022-08-24 DIAGNOSIS — Z950 Presence of cardiac pacemaker: Secondary | ICD-10-CM | POA: Insufficient documentation

## 2022-08-24 DIAGNOSIS — M5459 Other low back pain: Secondary | ICD-10-CM | POA: Diagnosis present

## 2022-08-24 NOTE — Progress Notes (Signed)
[employer] device representative programmed patient's device with consultation with our cardiology PA, Otilio Saber.  Patient was set to auto detect DOO 90.  Monitored patient during scan.

## 2023-07-22 NOTE — Progress Notes (Signed)
 [facility] [address] [place] [address] [phone]       OFFICE FOLLOW UP NOTE  Mr. Casey Rios Date of Birth:  Oct 27, 1929 Medical Record Number:  [id number]    Primary neurologist: Dr. Buck Reason for visit: CPAP follow-up    SUBJECTIVE:   CHIEF COMPLAINT:  Chief Complaint  Patient presents with   Sleep Apnea    Rm 8 alone Pt is well and stable, reports no OSA/CPAP concerns.     Follow-up visit:  Prior visit: 08/03/2022 with Dr. Buck (initial consult visit)  Brief HPI:   Casey Rios is a 89 y.o. male who is being followed for OSA on CPAP.  Longstanding history of sleep apnea on CPAP therapy and transferred care to Dr. Buck in 07/2022.  Last sleep study 03/2020 with total AHI 21.8/h and O2 nadir of 88%.  Current AutoPap set up 04/2021.      Interval history:  Patient returns for yearly CPAP follow-up visit.  Review of compliance report as below showing quite elevated residual AHI. Reviewed 365 day report with residual AHI still elevated at 17.9. he denies any changes to medications since prior visit but per epic review, pain management increase Butran patch dosage in the fall of 2024 although patient is unable to recall this. He does have chronic A fib but denies any recent cardiac changes. He is currently using a nasal pillow mask but does not feel mask is comfortable, denies trying any other mask but does not feel as though he could tolerate a full face mask. He has not noticed any changes to his sleep or daytime energy levels over the past year, he does feel his chronic pain contributes to daytime fatigue. ESS 8/24.       ROS:   14 system review of systems performed and negative with exception of those listed in HPI  PMH:  Past Medical History:  Diagnosis Date   Arthritis    CAD (coronary artery disease)    Cancer (HCC) 2020   melonomia on back   CKD (chronic kidney disease)    Dyspnea    with exertion   History of  aortic stenosis    s/p AVR, pericardial tissue ~ 03/2002   Hyperlipidemia    Hypertension    Macular degeneration    Sleep apnea    CPAP    PSH:  Past Surgical History:  Procedure Laterality Date   ATRIAL CARDIAC PACEMAKER INSERTION  09/05/2018   CARDIAC CATHETERIZATION     CARDIAC VALVE REPLACEMENT     aortic valve replacement, pericardial tissue ~ 03/2002   CHOLECYSTECTOMY     EYE SURGERY Bilateral    cateract removal   HERNIA REPAIR     MELANOMA EXCISION WITH SENTINEL LYMPH NODE BIOPSY N/A 07/26/2018   Procedure: BACK MELANOMA EXCISION;  Surgeon: Ebbie Cough, MD;  Location: [facility] OR;  Service: General;  Laterality: N/A;   SENTINEL NODE BIOPSY Left 07/26/2018   Procedure: LEFT SENTINEL LYMPH NODE BIOPSY;  Surgeon: Ebbie Cough, MD;  Location: [facility] OR;  Service: General;  Laterality: Left;   TONSILLECTOMY      Social History:  Social History   Socioeconomic History   Marital status: Married    Spouse name: Not on file   Number of children: Not on file   Years of education: Not on file   Highest education level: Not on file  Occupational History   Not on file  Tobacco Use   Smoking status: Former  Current packs/day: 2.00    Average packs/day: 2.0 packs/day for 20.0 years (40.0 ttl pk-yrs)    Types: Cigarettes   Smokeless tobacco: Never  Vaping Use   Vaping status: Never Used  Substance and Sexual Activity   Alcohol  use: Yes    Alcohol /week: 1.0 standard drink of alcohol     Types: 1 Glasses of wine per week    Comment: daily   Drug use: Never   Sexual activity: Not on file  Other Topics Concern   Not on file  Social History Narrative   Not on file   Social Drivers of Health   Financial Resource Strain: Low Risk  (02/28/2021)   Received from [facility] visits prior to 03/28/2022., [facility]   Overall Financial Resource Strain (CARDIA)    Difficulty of Paying Living Expenses: Not hard at all  Food Insecurity: Low Risk   (03/14/2023)   Received from [facility]   Hunger Vital Sign    Within the past 12 months, you worried that your food would run out before you got money to buy more: Never true    Within the past 12 months, the food you bought just didn't last and you didn't have money to get more. : Never true  Transportation Needs: No Transportation Needs (03/14/2023)   Received from [facility]    In the past 12 months, has lack of reliable transportation kept you from medical appointments, meetings, work or from getting things needed for daily living? : No  Physical Activity: Unknown (02/28/2021)   Received from [facility] visits prior to 03/28/2022., [facility]   Exercise Vital Sign    On average, how many days per week do you engage in moderate to strenuous exercise (like a brisk walk)?: 0 days    Minutes of Exercise per Session: Not on file  Stress: No Stress Concern Present (02/28/2021)   Received from [facility] visits prior to 03/28/2022., [facility]   [employer] of Occupational Health - Occupational Stress Questionnaire    Feeling of Stress : Not at all  Social Connections: Unknown (05/25/2021)   Received from [facility]   Social Network    Social Network: Not on file  Intimate Partner Violence: Unknown (05/01/2021)   Received from [facility]   HITS    Physically Hurt: Not on file    Insult or Talk Down To: Not on file    Threaten Physical Harm: Not on file    Scream or Curse: Not on file    Family History:  Family History  Problem Relation Age of Onset   Diabetes Mother    Emphysema Mother    Cancer Father    Heart attack Brother     Medications:   Current Outpatient Medications on File Prior to Visit  Medication Sig Dispense Refill   alendronate (FOSAMAX) 70 MG tablet Take 70 mg by mouth once a week.     amLODipine (NORVASC) 5 MG tablet Take 5 mg by mouth daily.     atorvastatin  (LIPITOR) 20 MG tablet  Take 20 mg by mouth daily.     buprenorphine (BUTRANS) 7.5 MCG/HR      carboxymethylcellulose (REFRESH PLUS) 0.5 % SOLN Place 1 drop into both eyes 3 (three) times daily as needed (dry eyes).     cholecalciferol (VITAMIN D3) 25 MCG (1000 UT) tablet Take 1,000 Units by mouth daily.     diltiazem  (DILACOR XR )  240 MG 24 hr capsule Take 240 mg by mouth daily.     ELIQUIS 2.5 MG TABS tablet Take 2.5 mg by mouth 2 (two) times daily.     fenofibrate  160 MG tablet Take 80 mg by mouth daily.     furosemide  (LASIX ) 20 MG tablet Take 10 mg by mouth daily. Take 1/2 pill once a day      Multiple Vitamins-Minerals (PRESERVISION AREDS 2+MULTI VIT PO) Take 1 capsule by mouth 2 (two) times a day.     RABEprazole (ACIPHEX) 20 MG tablet Take 20 mg by mouth daily.     triamterene -hydrochlorothiazide  (MAXZIDE -25) 37.5-25 MG tablet Take 1 tablet by mouth daily.     No current facility-administered medications on file prior to visit.    Allergies:   Allergies  Allergen Reactions   Robaxin  [Methocarbamol ] Hives and Shortness Of Breath    Dizziness   Celebrex [Celecoxib] Other (See Comments)    Upsets stomach    Loratadine Nausea And Vomiting and Other (See Comments)    Other reaction(s): GI Upset (intolerance)       OBJECTIVE:  Physical Exam  Vitals:   08/03/23 1023  BP: 106/62  Pulse: 84  Weight: 197 lb (89.4 kg)  Height: 5' 10 (1.778 m)   Body mass index is 28.27 kg/m. No results found.   General: well developed, well nourished, very pleasant elderly Caucasian male, seated, in no evident distress Head: head normocephalic and atraumatic.   Neck: supple with no carotid or supraclavicular bruits Cardiovascular: irregular rate and rhythm  Neurologic Exam Mental Status: Awake and fully alert. Oriented to place and time. Recent and remote memory intact. Attention span, concentration and fund of knowledge appropriate. Mood and affect appropriate.  Cranial Nerves: Pupils equal, briskly  reactive to light. Extraocular movements full without nystagmus. Visual fields full to confrontation. Hearing intact. Facial sensation intact. Face, tongue, palate moves normally and symmetrically.  Motor: Normal bulk and tone. Normal strength in all tested extremity muscles Gait and Station: Arises from chair without difficulty. Stance is hunched. Gait demonstrates antalgic gait with slow cautious steps without use of AD        ASSESSMENT/PLAN: Casey Rios is a 89 y.o. year old male    OSA on CPAP :  Compliance report shows satisfactory usage although elevated residual AHI at 24.3 with central apnea component 16.7. Further discussed with sleep manager Raelyn Dawn who recommends trial of different mask type and setting adjustments first. Will place order for change of interface and will adjust pressure from 5-15 with EPR 1 to 6-18 with EPR 1. Will recheck download in 2-3 weeks. If apneas remain uncontrolled, may need to consider titration study with possible need of BiPAP. Unclear cause of worsening apneas over the past year but do question if increased dose of Butran patch around 09/2022 contributing as apneas previously overall controlled. Has f/u with pain management coming up, plans on discussing further.  Discussed continued nightly usage with ensuring greater than 4 hours nightly for optimal benefit and per insurance purposes.   Continue to follow with DME company for any needed supplies or CPAP related concerns CPAP set up 04/2021, due for new machine 04/2026     Follow up in 1 year but may need earlier appointment based on above   CC:  PCP: Millicent Sharper, MD    I personally spent a total of 45 minutes in the care of the patient today including preparing to see the patient, performing a medically appropriate  exam/evaluation, counseling and educating, placing orders, referring and communicating with other health care professionals, and documenting clinical information in the  EHR.  Harlene Bogaert, AGNP-BC  [facility] [address] [place], [place] [address]  Phone [phone] Fax [phone] Note: This document was prepared with digital dictation and possible smart phrase technology. Any transcriptional errors that result from this process are unintentional.

## 2023-08-03 ENCOUNTER — Ambulatory Visit (INDEPENDENT_AMBULATORY_CARE_PROVIDER_SITE_OTHER): Payer: Medicare Other | Admitting: Adult Health

## 2023-08-03 ENCOUNTER — Encounter: Payer: Self-pay | Admitting: Adult Health

## 2023-08-03 VITALS — BP 106/62 | HR 84 | Ht 70.0 in | Wt 197.0 lb

## 2023-08-03 DIAGNOSIS — G4733 Obstructive sleep apnea (adult) (pediatric): Secondary | ICD-10-CM

## 2023-08-03 NOTE — Patient Instructions (Signed)
 You will be called by [facility] to review different mask options  I will make adjustments to your CPAP settings - I will recheck a download in 2-3 weeks and will call you to update you  If your residual amount of apneas remain uncontrolled, we may have to have you come in for a titration study as you may need a different form of therapy such as BiPAP to adequately control your sleep apnea but we will keep you updated on this     Follow up in 1 year or call earlier if needed

## 2023-08-05 NOTE — Progress Notes (Signed)
 Terrence Wishon D, CMA  Joylene Carlean Sprung, Patricia; Ziegler, Melissa; Tucker, Dolanda; Cain, Avenal New orders have been placed for the above pt, DOB: [birth year] Thanks

## 2023-08-30 ENCOUNTER — Telehealth: Payer: Self-pay | Admitting: Adult Health

## 2023-08-30 DIAGNOSIS — G4733 Obstructive sleep apnea (adult) (pediatric): Secondary | ICD-10-CM

## 2023-08-30 NOTE — Telephone Encounter (Signed)
 Repeat CPAP download showed improved although persistently elevated residual AHI currently at 20.1 (previously 28.4), with central component 14.1 (previously 20.5). At this time, will request patient pursues titration study with possible need of transition to BiPAP.

## 2023-08-30 NOTE — Telephone Encounter (Signed)
 Spoke w/Pt regarding NP recommendations. Pt voiced understanding but stated he had asked about trying a different nasal pillow but has not heard from his DME provider. He would like to try that before he does a titration study. Verified order was entered - will send additional message to DME provider regarding refitting for a nasal pillow.

## 2023-08-31 NOTE — Telephone Encounter (Signed)
 That is fine. Order already placed for titration study but can hold off until he receives a new mask. Please have him call us  once he receives a new mask and I will plan on repeating a DL 6 weeks after. Thank you.

## 2023-08-31 NOTE — Telephone Encounter (Signed)
 Spoke w/Pt to make him aware the titration study can be put on hold. Asked Pt to please call the office to make us  aware when he receives his new mask so we can get an activity DL 6 weeks following. Pt voiced understanding and thanks for the call.

## 2023-09-01 ENCOUNTER — Telehealth: Payer: Self-pay | Admitting: Adult Health

## 2023-09-01 NOTE — Telephone Encounter (Signed)
 CPAP Medicare [employer] no auth req.  Spoke with the patient he stated he wants to try the nose piece first to see if that helps him before scheduling a SS.

## 2024-08-02 ENCOUNTER — Ambulatory Visit: Admitting: Adult Health
